# Patient Record
Sex: Female | Born: 1985 | Race: White | Hispanic: No | Marital: Married | Smoking: Former smoker
Health system: Southern US, Community
[De-identification: ages and names within clinical notes are randomized; demographics above are authoritative.]

## PROBLEM LIST (undated history)

## (undated) DIAGNOSIS — T7840XA Allergy, unspecified, initial encounter: Secondary | ICD-10-CM

## (undated) DIAGNOSIS — F419 Anxiety disorder, unspecified: Secondary | ICD-10-CM

## (undated) DIAGNOSIS — F32A Depression, unspecified: Secondary | ICD-10-CM

## (undated) DIAGNOSIS — Z789 Other specified health status: Secondary | ICD-10-CM

## (undated) HISTORY — DX: Anxiety disorder, unspecified: F41.9

## (undated) HISTORY — DX: Other specified health status: Z78.9

## (undated) HISTORY — PX: NO PAST SURGERIES: SHX2092

## (undated) HISTORY — DX: Depression, unspecified: F32.A

## (undated) HISTORY — DX: Allergy, unspecified, initial encounter: T78.40XA

---

## 2016-11-21 DIAGNOSIS — O99210 Obesity complicating pregnancy, unspecified trimester: Secondary | ICD-10-CM | POA: Insufficient documentation

## 2016-11-21 DIAGNOSIS — O99211 Obesity complicating pregnancy, first trimester: Secondary | ICD-10-CM | POA: Insufficient documentation

## 2019-11-03 ENCOUNTER — Ambulatory Visit (INDEPENDENT_AMBULATORY_CARE_PROVIDER_SITE_OTHER): Payer: BC Managed Care – PPO | Admitting: Obstetrics and Gynecology

## 2019-11-03 ENCOUNTER — Other Ambulatory Visit (HOSPITAL_COMMUNITY)
Admission: RE | Admit: 2019-11-03 | Discharge: 2019-11-03 | Disposition: A | Discharge status: Home / Self Care | Payer: BC Managed Care – PPO | Source: Ambulatory Visit | Attending: Obstetrics and Gynecology | Admitting: Obstetrics and Gynecology

## 2019-11-03 ENCOUNTER — Other Ambulatory Visit: Payer: Self-pay

## 2019-11-03 ENCOUNTER — Encounter: Payer: Self-pay | Admitting: Obstetrics and Gynecology

## 2019-11-03 VITALS — BP 129/82 | HR 129 | Ht 66.0 in

## 2019-11-03 DIAGNOSIS — Z23 Encounter for immunization: Secondary | ICD-10-CM

## 2019-11-03 DIAGNOSIS — F3281 Premenstrual dysphoric disorder: Secondary | ICD-10-CM

## 2019-11-03 DIAGNOSIS — Z124 Encounter for screening for malignant neoplasm of cervix: Secondary | ICD-10-CM

## 2019-11-03 DIAGNOSIS — Z01419 Encounter for gynecological examination (general) (routine) without abnormal findings: Secondary | ICD-10-CM

## 2019-11-03 DIAGNOSIS — Z1239 Encounter for other screening for malignant neoplasm of breast: Secondary | ICD-10-CM

## 2019-11-03 MED ORDER — CITALOPRAM HYDROBROMIDE 10 MG PO TABS: 20.0000 mg | ORAL_TABLET | Freq: Every day | ORAL | 11 refills | Status: DC

## 2019-11-03 NOTE — Patient Instructions (Signed)
Premenstrual Syndrome Premenstrual syndrome (PMS) is a group of physical, emotional, and behavioral symptoms that affect women of childbearing age as part of their menstrual cycle. PMS starts 1-2 weeks before the start of a woman's menstrual period and goes away a few days after menstrual bleeding starts. It often happens in a predictable pattern (recurs). PMS may cause other health conditions to become worse, such as asthma, allergies, and migraines. PMS can range from mild to severe. When it is severe, it is called premenstrual dysphoric disorder (PMDD). PMS may interfere with normal daily activities. What are the causes? The cause of this condition is not known, but it seems to be related to hormone changes that happen before menstruation. What are the signs or symptoms? Symptoms of this condition often happen every month. They go away completely after your period starts. Physical symptoms of this condition include:  Bloating.  Breast pain.  Headaches.  Extreme fatigue.  Backaches.  Swelling of the hands and feet.  Weight gain.  Hot flashes. Emotional and behavioral symptoms of this condition include:  Mood swings.  Depression.  Angry outbursts.  Irritability.  Anxiety.  Crying spells.  Food cravings or appetite changes.  Changes in sexual desire.  Confusion.  Aggression.  Social withdrawal.  Poor concentration. How is this diagnosed? This condition may be diagnosed based on a history of your symptoms. This condition is generally diagnosed if symptoms of PMS:  Are present in the 5 days before your period starts.  End within 4 days after your period starts.  Happen at least 3 months in a row.  Interfere with some of your normal activities. Other conditions that can cause some of these symptoms must be ruled out before PMS can be diagnosed. These include depression, anxiety, anemia, and thyroid problems. How is this treated? This condition may be treated  by:  Maintaining a healthy lifestyle. This includes eating a well-balanced diet and exercising regularly.  Taking medicines. Medicines can help relieve symptoms such as cramps, aches, pains, headaches, and breast tenderness. Depending on the severity of the condition, your health care provider may recommend various over-the-counter pain medicines. Follow these instructions at home: Eating and drinking   Eat a well-balanced diet.  Avoid caffeine and alcohol.  Limit the amount of salt and salty foods you eat. This will help reduce bloating.  Drink enough fluid to keep your urine pale yellow.  Take a multivitamin if told to do so by your health care provider. Lifestyle   Do not use any products that contain nicotine or tobacco, such as cigarettes, e-cigarettes, and chewing tobacco. If you need help quitting, ask your health care provider.  Exercise regularly as suggested by your health care provider.  Get enough sleep. For most adults, this is 7-8 hours of sleep each night.  Practice relaxation techniques such as yoga, tai chi, or meditation.  Find healthy ways to manage stress. General instructions   For 2-3 months, write down your symptoms, their severity, and how long they last. This will help your health care provider choose the best treatment for you.  Take over-the-counter and prescription medicines only as told by your health care provider.  If you are using birth control pills (oral contraceptives), use them as told by your health care provider. Contact a health care provider if:  Your symptoms get worse.  You develop new symptoms.  You have trouble doing your daily activities. Summary  Premenstrual syndrome (PMS) is a group of physical, emotional, and behavioral symptoms that   affect women of childbearing age.  PMS starts 1-2 weeks before the start of a woman's period and goes away a few days after the period starts.  PMS is treated by maintaining a healthy  lifestyle and taking medicines to relieve the symptoms. This information is not intended to replace advice given to you by your health care provider. Make sure you discuss any questions you have with your health care provider. Document Released: 12/06/2000 Document Revised: 07/22/2018 Document Reviewed: 07/22/2018 Elsevier Patient Education  2020 [employer].

## 2019-11-03 NOTE — Progress Notes (Signed)
Gynecology Annual Exam   PCP: No primary care provider on file.  Chief Complaint:  Chief Complaint  Patient presents with  . Gynecologic Exam    History of Present Illness: Patient is a 33 y.o. No obstetric history on file. presents for annual exam. The patient has no complaints today.   LMP: Patient's last menstrual period was 10/09/2019. Average Interval: regular, 28 days Duration of flow: 3 days Heavy Menses: no Clots: no Intermenstrual Bleeding: no Postcoital Bleeding: no Dysmenorrhea: yes  The patient is sexually active. She currently uses rhythm method for contraception. She denies dyspareunia.  The patient does perform self breast exams.  There is no notable family history of breast or ovarian cancer in her family.  The patient wears seatbelts: yes.   The patient has regular exercise: not asked.    The patient denies current symptoms of depression.    Review of Systems: Review of Systems  Constitutional: Negative for chills and fever.  HENT: Negative for congestion.   Respiratory: Negative for cough and shortness of breath.   Cardiovascular: Negative for chest pain and palpitations.  Gastrointestinal: Negative for abdominal pain, constipation, diarrhea, heartburn, nausea and vomiting.  Genitourinary: Negative for dysuria, frequency and urgency.  Skin: Negative for itching and rash.  Neurological: Negative for dizziness and headaches.  Endo/Heme/Allergies: Negative for polydipsia.  Psychiatric/Behavioral: Negative for depression.    Past Medical History:  Past Medical History:  Diagnosis Date  . No pertinent past medical history     Past Surgical History:  Past Surgical History:  Procedure Laterality Date  . NO PAST SURGERIES      Gynecologic History:  Patient's last menstrual period was 10/09/2019. Contraception: rhythm method  Obstetric History: No obstetric history on file.  Family History:  History reviewed. No pertinent family history.   Social History:  Social History   Socioeconomic History  . Marital status: Single    Spouse name: Not on file  . Number of children: Not on file  . Years of education: Not on file  . Highest education level: Not on file  Occupational History  . Occupation: Games developer  Social Needs  . Financial resource strain: Not on file  . Food insecurity    Worry: Not on file    Inability: Not on file  . Transportation needs    Medical: Not on file    Non-medical: Not on file  Tobacco Use  . Smoking status: Never Smoker  . Smokeless tobacco: Current User  Substance and Sexual Activity  . Alcohol use: Yes    Comment: Rerely  . Drug use: Never  . Sexual activity: Yes    Partners: Male    Birth control/protection: None  Lifestyle  . Physical activity    Days per week: Not on file    Minutes per session: Not on file  . Stress: Not on file  Relationships  . Social Musician on phone: Not on file    Gets together: Not on file    Attends religious service: Not on file    Active member of club or organization: Not on file    Attends meetings of clubs or organizations: Not on file    Relationship status: Not on file  . Intimate partner violence    Fear of current or ex partner: Not on file    Emotionally abused: Not on file    Physically abused: Not on file    Forced sexual activity: Not on  file  Other Topics Concern  . Not on file  Social History Narrative  . Not on file    Allergies:  No Known Allergies  Medications: Prior to Admission medications   Not on File    Physical Exam Vitals: Blood pressure 129/82, pulse (!) 129, height 5\' 6"  (1.676 m), last menstrual period 10/09/2019.   General: NAD HEENT: normocephalic, anicteric Thyroid: no enlargement, no palpable nodules Pulmonary: No increased work of breathing, CTAB Cardiovascular: RRR, distal pulses 2+ Breast: Breast symmetrical, no tenderness, no palpable nodules or masses, no skin or nipple  retraction present, no nipple discharge.  No axillary or supraclavicular lymphadenopathy. Abdomen: NABS, soft, non-tender, non-distended.  Umbilicus without lesions.  No hepatomegaly, splenomegaly or masses palpable. No evidence of hernia  Genitourinary:  External: Normal external female genitalia.  Normal urethral meatus, normal Bartholin's and Skene's glands.    Vagina: Normal vaginal mucosa, no evidence of prolapse.    Cervix: Grossly normal in appearance, no bleeding  Uterus: Non-enlarged, mobile, normal contour.  No CMT  Adnexa: ovaries non-enlarged, no adnexal masses  Rectal: deferred  Lymphatic: no evidence of inguinal lymphadenopathy Extremities: no edema, erythema, or tenderness Neurologic: Grossly intact Psychiatric: mood appropriate, affect full  Female chaperone present for pelvic and breast  portions of the physical exam    Assessment: 33 y.o. No obstetric history on file. routine annual exam  Plan: Problem List Items Addressed This Visit    None    Visit Diagnoses    Encounter for gynecological examination without abnormal finding    -  Primary   Screening for malignant neoplasm of cervix       Relevant Orders   Cytology - PAP   Breast screening       Flu vaccine need       Relevant Orders   Flu Vaccine QUAD 36+ mos IM (Fluarix, Quad PF) (Completed)   PMDD (premenstrual dysphoric disorder)       Relevant Medications   citalopram (CELEXA) 10 MG tablet      1) STI screening  wasoffered and declined  2)  ASCCP guidelines and rational discussed.  Patient opts for every 3 years screening interval  3) Contraception - the patient is currently using  rhythm method.  She is happy with her current form of contraception and plans to continue - considering conception in the next year, discussed starting PNV prior to conceiving  4) Routine healthcare maintenance including cholesterol, diabetes screening discussed managed by PCP  5) PMDD - start citalopram 10mg  po  daily.  Discussed continuous vs cyclical dosing.  6) Gardasil - started series shot 1/3.  Monogamous relationship/low risk discussed pros and cons of completing series at this time  7) Influenza vaccination - offered and accepted  8) Return in about 6 weeks (around 12/15/2019) for medication follow up phone.   Malachy Mood, MD, [facility], [facility] 11/03/2019, 2:58 PM

## 2019-11-09 LAB — CYTOLOGY - PAP
Adequacy: ABSENT
Comment: NEGATIVE
Comment: NEGATIVE
Comment: NEGATIVE
Diagnosis: NEGATIVE
HPV 16: NEGATIVE
HPV 18 / 45: NEGATIVE
High risk HPV: POSITIVE — AB

## 2019-11-26 ENCOUNTER — Telehealth: Payer: Self-pay | Admitting: Obstetrics and Gynecology

## 2019-11-26 NOTE — Telephone Encounter (Signed)
Patient is calling for labs results. Please advise. 

## 2019-11-29 ENCOUNTER — Encounter: Payer: Self-pay | Admitting: Obstetrics and Gynecology

## 2019-11-30 ENCOUNTER — Telehealth: Payer: Self-pay | Admitting: Obstetrics and Gynecology

## 2019-11-30 NOTE — Telephone Encounter (Signed)
Patient returning AMS call to go over results.  Same cb.

## 2019-12-10 ENCOUNTER — Ambulatory Visit: Payer: BC Managed Care – PPO | Admitting: Obstetrics and Gynecology

## 2020-12-23 NOTE — L&D Delivery Note (Signed)
Delivery Note At 8:12 PM a viable female was delivered via Vaginal, Spontaneous (Presentation: Left Occiput Anterior).  APGAR: 8, 9; weight  .   Placenta status: Spontaneous, Intact.  Cord: 3 vessels with the following complications: None.  Cord pH: N/A  Anesthesia: Epidural Episiotomy: None Lacerations: 2nd degree Suture Repair: 2.0 Vicryl on bulbocavernosus and 3.0 Monocryl for superficial layer Est. Blood Loss (mL): 750  Mom to postpartum.  Baby to Couplet care / Skin to Skin.  Vena [place] 12/22/2021, 8:50 PM

## 2021-05-16 ENCOUNTER — Encounter: Payer: BC Managed Care – PPO | Admitting: Obstetrics and Gynecology

## 2021-05-18 ENCOUNTER — Other Ambulatory Visit: Payer: Self-pay

## 2021-05-18 ENCOUNTER — Encounter: Payer: Self-pay | Admitting: Advanced Practice Midwife

## 2021-05-18 ENCOUNTER — Ambulatory Visit (INDEPENDENT_AMBULATORY_CARE_PROVIDER_SITE_OTHER): Payer: BC Managed Care – PPO | Admitting: Advanced Practice Midwife

## 2021-05-18 ENCOUNTER — Other Ambulatory Visit (HOSPITAL_COMMUNITY)
Admission: RE | Admit: 2021-05-18 | Discharge: 2021-05-18 | Disposition: A | Discharge status: Home / Self Care | Payer: BC Managed Care – PPO | Source: Ambulatory Visit | Attending: Obstetrics and Gynecology | Admitting: Obstetrics and Gynecology

## 2021-05-18 VITALS — BP 120/72 | HR 85 | Wt 220.0 lb

## 2021-05-18 DIAGNOSIS — Z1159 Encounter for screening for other viral diseases: Secondary | ICD-10-CM | POA: Diagnosis not present

## 2021-05-18 DIAGNOSIS — Z3401 Encounter for supervision of normal first pregnancy, first trimester: Secondary | ICD-10-CM

## 2021-05-18 DIAGNOSIS — Z369 Encounter for antenatal screening, unspecified: Secondary | ICD-10-CM | POA: Diagnosis not present

## 2021-05-18 DIAGNOSIS — O99211 Obesity complicating pregnancy, first trimester: Secondary | ICD-10-CM | POA: Diagnosis not present

## 2021-05-18 DIAGNOSIS — Z3490 Encounter for supervision of normal pregnancy, unspecified, unspecified trimester: Secondary | ICD-10-CM | POA: Insufficient documentation

## 2021-05-18 DIAGNOSIS — Z91410 Personal history of adult physical and sexual abuse: Secondary | ICD-10-CM

## 2021-05-18 DIAGNOSIS — Z124 Encounter for screening for malignant neoplasm of cervix: Secondary | ICD-10-CM | POA: Diagnosis not present

## 2021-05-18 DIAGNOSIS — N926 Irregular menstruation, unspecified: Secondary | ICD-10-CM

## 2021-05-18 DIAGNOSIS — Z113 Encounter for screening for infections with a predominantly sexual mode of transmission: Secondary | ICD-10-CM

## 2021-05-18 DIAGNOSIS — Z131 Encounter for screening for diabetes mellitus: Secondary | ICD-10-CM | POA: Diagnosis not present

## 2021-05-18 DIAGNOSIS — Z8659 Personal history of other mental and behavioral disorders: Secondary | ICD-10-CM

## 2021-05-18 DIAGNOSIS — Z3A12 12 weeks gestation of pregnancy: Secondary | ICD-10-CM

## 2021-05-18 LAB — POCT URINE PREGNANCY: Preg Test, Ur: POSITIVE — AB

## 2021-05-18 NOTE — Patient Instructions (Signed)
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
## 2021-05-19 LAB — HGB A1C W/O EAG: Hgb A1c MFr Bld: 5.1 % (ref 4.8–5.6)

## 2021-05-19 LAB — RPR+RH+ABO+RUB AB+AB SCR+CB...
Antibody Screen: NEGATIVE
HIV Screen 4th Generation wRfx: NONREACTIVE
Hematocrit: 42.3 % (ref 34.0–46.6)
Hemoglobin: 14.1 g/dL (ref 11.1–15.9)
Hepatitis B Surface Ag: NEGATIVE
MCH: 29.3 pg (ref 26.6–33.0)
MCHC: 33.3 g/dL (ref 31.5–35.7)
MCV: 88 fL (ref 79–97)
Platelets: 328 10*3/uL (ref 150–450)
RBC: 4.82 x10E6/uL (ref 3.77–5.28)
RDW: 13.1 % (ref 11.7–15.4)
RPR Ser Ql: NONREACTIVE
Rh Factor: NEGATIVE
Rubella Antibodies, IGG: 5.21 index (ref >0.99)
Varicella zoster IgG: 371 index (ref >165)
WBC: 10.8 10*3/uL (ref 3.4–10.8)

## 2021-05-19 LAB — HEPATITIS B SURFACE ANTIBODY,QUALITATIVE: Hep B Surface Ab, Qual: REACTIVE

## 2021-05-19 LAB — HEPATITIS C ANTIBODY: Hep C Virus Ab: <0.1 s/co ratio (ref 0.0–0.9)

## 2021-05-20 LAB — URINE CULTURE

## 2021-05-28 ENCOUNTER — Other Ambulatory Visit: Payer: Self-pay

## 2021-05-28 ENCOUNTER — Encounter: Payer: Self-pay | Admitting: Obstetrics and Gynecology

## 2021-05-28 ENCOUNTER — Ambulatory Visit (INDEPENDENT_AMBULATORY_CARE_PROVIDER_SITE_OTHER): Payer: BC Managed Care – PPO | Admitting: Obstetrics and Gynecology

## 2021-05-28 VITALS — BP 130/80 | Wt 220.0 lb

## 2021-05-28 DIAGNOSIS — O99211 Obesity complicating pregnancy, first trimester: Secondary | ICD-10-CM

## 2021-05-28 DIAGNOSIS — Z3A10 10 weeks gestation of pregnancy: Secondary | ICD-10-CM | POA: Diagnosis not present

## 2021-05-28 DIAGNOSIS — Z369 Encounter for antenatal screening, unspecified: Secondary | ICD-10-CM

## 2021-05-28 DIAGNOSIS — Z3401 Encounter for supervision of normal first pregnancy, first trimester: Secondary | ICD-10-CM | POA: Diagnosis not present

## 2021-05-28 LAB — CYTOLOGY - PAP
Chlamydia: NEGATIVE
Comment: NEGATIVE
Comment: NEGATIVE
Comment: NEGATIVE
Comment: NEGATIVE
Comment: NORMAL
HPV 16: NEGATIVE
HPV 18 / 45: NEGATIVE
High risk HPV: POSITIVE — AB
Neisseria Gonorrhea: NEGATIVE
Trichomonas: NEGATIVE
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
## 2021-05-30 ENCOUNTER — Encounter: Payer: Self-pay | Admitting: Obstetrics and Gynecology

## 2021-05-30 NOTE — Progress Notes (Signed)
ULTRASOUND REPORT  Location: [facility] Date of Service: 05/30/2021   Indications:dating Findings:  Mason Jim intrauterine pregnancy is visualized with a CRL consistent with [redacted]w[redacted]d gestation, giving an (U/S) EDD of 12/18/2021. The (U/S) EDD is not consistent with the clinically established EDD of 11/28/2021.  FHR: 182 BPM CRL measurement: 4.03 cm Yolk sac is not visualized. Amnion: not visualized   Right Ovary not visualized Left Ovary appears to have a small ~3 cm simple cyst Corpus luteal cyst:  is not visualized Survey of the adnexa demonstrates no adnexal masses. There is no free peritoneal fluid in the cul de sac.  Impression: 1. [redacted]w[redacted]d Viable Singleton Intrauterine pregnancy by U/S. 2. (U/S) EDD is not consistent with Clinically established EDD of 11/28/2021.   3. EDD changed based on today's ultrasound.  There is a viable singleton gestation.  Detailed evaluation of the fetal anatomy is precluded by early gestational age.  It must be noted that a normal ultrasound particular at this early gestational age is unable to rule out fetal aneuploidy, risk of first trimester miscarriage, or anatomic birth defects. I personally performed and interpreted the ultrasound.   Thomasene Mohair, MD, [facility], [facility] 05/30/2021 9:11 AM     Thomasene Mohair, MD

## 2021-06-26 ENCOUNTER — Ambulatory Visit (INDEPENDENT_AMBULATORY_CARE_PROVIDER_SITE_OTHER): Payer: BC Managed Care – PPO | Admitting: Obstetrics and Gynecology

## 2021-06-26 ENCOUNTER — Encounter: Payer: Self-pay | Admitting: Obstetrics and Gynecology

## 2021-06-26 ENCOUNTER — Other Ambulatory Visit: Payer: Self-pay

## 2021-06-26 VITALS — BP 120/80 | Ht 66.0 in | Wt 215.0 lb

## 2021-06-26 DIAGNOSIS — Z3401 Encounter for supervision of normal first pregnancy, first trimester: Secondary | ICD-10-CM | POA: Diagnosis not present

## 2021-06-26 DIAGNOSIS — Z1379 Encounter for other screening for genetic and chromosomal anomalies: Secondary | ICD-10-CM

## 2021-06-26 DIAGNOSIS — R87612 Low grade squamous intraepithelial lesion on cytologic smear of cervix (LGSIL): Secondary | ICD-10-CM | POA: Diagnosis not present

## 2021-06-26 DIAGNOSIS — Z3A15 15 weeks gestation of pregnancy: Secondary | ICD-10-CM | POA: Diagnosis not present

## 2021-06-26 NOTE — Progress Notes (Signed)
   HPI:  Erin Diaz is a 35 y.o.  G1P0000  who presents today for evaluation and management of abnormal cervical cytology.    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 No pertinent family history.  ALLERGIES:  Patient has no known allergies.  No current outpatient medications on file prior to visit.   No current facility-administered medications on file prior to visit.    Physical Exam: -Vitals:  BP 120/80   Ht 5\' 6"  (1.676 m)   Wt 215 lb (97.5 kg)   LMP 02/21/2021 (Within Days)   BMI 34.70 kg/m  GEN: WD, WN, NAD.  A+ O x 3, good mood and affect. ABD:  NT, ND.  Soft, no masses.  No hernias noted.   Pelvic:   Vulva: Normal appearance.  No lesions.  Vagina: No lesions or abnormalities noted.  Support: Normal pelvic support.  Urethra No masses tenderness or scarring.  Meatus Normal size without lesions or prolapse.  Cervix: See below.  Anus: Normal exam.  No lesions.  Perineum: Normal exam.  No lesions.        Bimanual   Uterus: Normal size.  Non-tender.  Mobile.  AV.  Adnexae: No masses.  Non-tender to palpation.  Cul-de-sac: Negative for abnormality.   PROCEDURE: 1.  Urine Pregnancy Test:  not done 2.  Colposcopy performed with 4% acetic acid after verbal consent obtained                                        -Aceto-white Lesions Location(s): diffusely  mild-to-moderate from 12-2 o'clock and posteriorly 4-6 o'clock.              -Biopsy not performed              -ECC indicated and performed: No.     -Biopsy sites made hemostatic with pressure, AgNO3, and/or Monsel's solution   -Satisfactory colposcopy: Yes.      -Evidence of Invasive cervical CA: NO  ASSESSMENT:  Erin Diaz is a 35 y.o. G1P0000 here for  1. LGSIL on Pap smear of cervix    PLAN: I discussed the grading system of pap smears and HPV high risk viral types.  We will discuss and base management after colpo results return.  anatomy u/s order placed today NIPT today      34, MD  [facility], [facility] 06/26/2021  5:20 PM

## 2021-06-28 ENCOUNTER — Other Ambulatory Visit: Payer: Self-pay

## 2021-06-28 ENCOUNTER — Ambulatory Visit (INDEPENDENT_AMBULATORY_CARE_PROVIDER_SITE_OTHER): Payer: BC Managed Care – PPO

## 2021-06-28 ENCOUNTER — Telehealth: Payer: Self-pay

## 2021-06-28 ENCOUNTER — Other Ambulatory Visit: Payer: Self-pay | Admitting: Obstetrics & Gynecology

## 2021-06-28 DIAGNOSIS — R350 Frequency of micturition: Secondary | ICD-10-CM

## 2021-06-28 LAB — POCT URINALYSIS DIPSTICK
Bilirubin, UA: NEGATIVE
Glucose, UA: NEGATIVE
Ketones, UA: NEGATIVE
Nitrite, UA: NEGATIVE
Protein, UA: NEGATIVE
Spec Grav, UA: 1.015 (ref 1.010–1.025)
Urobilinogen, UA: 0.2 E.U./dL
pH, UA: 7.5 (ref 5.0–8.0)

## 2021-06-28 MED ORDER — NITROFURANTOIN MONOHYD MACRO 100 MG PO CAPS: 100.0000 mg | ORAL_CAPSULE | Freq: Two times a day (BID) | ORAL | 0 refills | Status: DC

## 2021-06-28 NOTE — Progress Notes (Signed)
Let her know preg safe ABX called in

## 2021-06-28 NOTE — Progress Notes (Signed)
Presents today for urinary symptoms (frequency). Urinalysis performed and forwarded to [facility] for advise.

## 2021-06-30 LAB — MATERNIT 21 PLUS CORE, BLOOD
Fetal Fraction: 4
Result (T21): NEGATIVE
Trisomy 13 (Patau syndrome): NEGATIVE
Trisomy 18 (Edwards syndrome): NEGATIVE
Trisomy 21 (Down syndrome): NEGATIVE

## 2021-07-07 ENCOUNTER — Other Ambulatory Visit: Payer: Self-pay

## 2021-07-10 ENCOUNTER — Other Ambulatory Visit: Payer: Self-pay | Admitting: Obstetrics & Gynecology

## 2021-07-10 MED ORDER — AMOXICILLIN 500 MG PO CAPS: 500.0000 mg | ORAL_CAPSULE | Freq: Three times a day (TID) | ORAL | 2 refills | Status: DC

## 2021-07-24 ENCOUNTER — Encounter: Payer: BC Managed Care – PPO | Admitting: Obstetrics & Gynecology

## 2021-07-31 ENCOUNTER — Ambulatory Visit
Admission: RE | Admit: 2021-07-31 | Discharge: 2021-07-31 | Disposition: A | Discharge status: Home / Self Care | Payer: BC Managed Care – PPO | Source: Ambulatory Visit | Attending: Obstetrics and Gynecology | Admitting: Obstetrics and Gynecology

## 2021-07-31 ENCOUNTER — Other Ambulatory Visit: Payer: Self-pay

## 2021-07-31 DIAGNOSIS — Z3A20 20 weeks gestation of pregnancy: Secondary | ICD-10-CM | POA: Diagnosis not present

## 2021-07-31 DIAGNOSIS — Z3401 Encounter for supervision of normal first pregnancy, first trimester: Secondary | ICD-10-CM | POA: Diagnosis not present

## 2021-07-31 DIAGNOSIS — Z3492 Encounter for supervision of normal pregnancy, unspecified, second trimester: Secondary | ICD-10-CM | POA: Diagnosis not present

## 2021-08-01 ENCOUNTER — Encounter: Payer: Self-pay | Admitting: Obstetrics and Gynecology

## 2021-08-01 ENCOUNTER — Ambulatory Visit (INDEPENDENT_AMBULATORY_CARE_PROVIDER_SITE_OTHER): Payer: BC Managed Care – PPO | Admitting: Obstetrics and Gynecology

## 2021-08-01 VITALS — BP 118/70 | Ht 66.0 in | Wt 217.6 lb

## 2021-08-01 DIAGNOSIS — Z3A20 20 weeks gestation of pregnancy: Secondary | ICD-10-CM

## 2021-08-01 DIAGNOSIS — Z3402 Encounter for supervision of normal first pregnancy, second trimester: Secondary | ICD-10-CM

## 2021-08-01 DIAGNOSIS — O99212 Obesity complicating pregnancy, second trimester: Secondary | ICD-10-CM

## 2021-08-01 DIAGNOSIS — Z6791 Unspecified blood type, Rh negative: Secondary | ICD-10-CM

## 2021-08-01 DIAGNOSIS — O26899 Other specified pregnancy related conditions, unspecified trimester: Secondary | ICD-10-CM | POA: Insufficient documentation
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
## 2021-08-31 ENCOUNTER — Ambulatory Visit (INDEPENDENT_AMBULATORY_CARE_PROVIDER_SITE_OTHER): Payer: BC Managed Care – PPO | Admitting: Obstetrics and Gynecology

## 2021-08-31 ENCOUNTER — Encounter: Payer: Self-pay | Admitting: Obstetrics and Gynecology

## 2021-08-31 ENCOUNTER — Other Ambulatory Visit: Payer: Self-pay

## 2021-08-31 VITALS — BP 118/80 | Wt 221.0 lb

## 2021-08-31 DIAGNOSIS — Z3A24 24 weeks gestation of pregnancy: Secondary | ICD-10-CM

## 2021-08-31 DIAGNOSIS — O99212 Obesity complicating pregnancy, second trimester: Secondary | ICD-10-CM

## 2021-08-31 DIAGNOSIS — Z113 Encounter for screening for infections with a predominantly sexual mode of transmission: Secondary | ICD-10-CM

## 2021-08-31 DIAGNOSIS — Z6791 Unspecified blood type, Rh negative: Secondary | ICD-10-CM

## 2021-08-31 DIAGNOSIS — O26899 Other specified pregnancy related conditions, unspecified trimester: Secondary | ICD-10-CM

## 2021-08-31 DIAGNOSIS — Z131 Encounter for screening for diabetes mellitus: Secondary | ICD-10-CM

## 2021-08-31 DIAGNOSIS — Z3402 Encounter for supervision of normal first pregnancy, second trimester: Secondary | ICD-10-CM
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
## 2021-09-28 ENCOUNTER — Encounter: Payer: Self-pay | Admitting: Advanced Practice Midwife

## 2021-09-28 ENCOUNTER — Ambulatory Visit (INDEPENDENT_AMBULATORY_CARE_PROVIDER_SITE_OTHER): Payer: BC Managed Care – PPO | Admitting: Advanced Practice Midwife

## 2021-09-28 ENCOUNTER — Other Ambulatory Visit: Payer: BC Managed Care – PPO

## 2021-09-28 ENCOUNTER — Other Ambulatory Visit: Payer: Self-pay

## 2021-09-28 VITALS — BP 120/80 | Wt 220.0 lb

## 2021-09-28 DIAGNOSIS — Z6791 Unspecified blood type, Rh negative: Secondary | ICD-10-CM | POA: Diagnosis not present

## 2021-09-28 DIAGNOSIS — Z3A28 28 weeks gestation of pregnancy: Secondary | ICD-10-CM

## 2021-09-28 DIAGNOSIS — O26893 Other specified pregnancy related conditions, third trimester: Secondary | ICD-10-CM

## 2021-09-28 DIAGNOSIS — Z3403 Encounter for supervision of normal first pregnancy, third trimester: Secondary | ICD-10-CM | POA: Diagnosis not present

## 2021-09-28 DIAGNOSIS — Z131 Encounter for screening for diabetes mellitus: Secondary | ICD-10-CM

## 2021-09-28 DIAGNOSIS — Z113 Encounter for screening for infections with a predominantly sexual mode of transmission: Secondary | ICD-10-CM

## 2021-09-28 DIAGNOSIS — Z130 Encounter for screening for diseases of the blood and blood-forming organs and certain disorders involving the immune mechanism: Secondary | ICD-10-CM

## 2021-09-28 DIAGNOSIS — Z369 Encounter for antenatal screening, unspecified: Secondary | ICD-10-CM

## 2021-09-28 LAB — POCT URINALYSIS DIPSTICK OB
Glucose, UA: NEGATIVE
POC,PROTEIN,UA: NEGATIVE

## 2021-09-28 MED ORDER — RHO D IMMUNE GLOBULIN 1500 UNIT/2ML IJ SOSY
300.0000 ug | PREFILLED_SYRINGE | Freq: Once | INTRAMUSCULAR | Status: AC
  Administered 2021-09-28: 300 ug via INTRAMUSCULAR
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
## 2021-09-28 NOTE — Patient Instructions (Signed)

## 2021-09-28 NOTE — Addendum Note (Signed)
Addended by: Cornelius Moras D on: 09/28/2021 03:46 PM   Modules accepted: Orders

## 2021-09-28 NOTE — Addendum Note (Signed)
Addended by: Cornelius Moras D on: 09/28/2021 04:03 PM   Modules accepted: Orders

## 2021-09-29 LAB — 28 WEEKS RH-PANEL
Antibody Screen: NEGATIVE
Basophils Absolute: 0 10*3/uL (ref 0.0–0.2)
Basos: 0 %
EOS (ABSOLUTE): 0 10*3/uL (ref 0.0–0.4)
Eos: 0 %
Gestational Diabetes Screen: 124 mg/dL (ref 65–139)
HIV Screen 4th Generation wRfx: NONREACTIVE
Hematocrit: 37 % (ref 34.0–46.6)
Hemoglobin: 12.6 g/dL (ref 11.1–15.9)
Immature Grans (Abs): 0.1 10*3/uL (ref 0.0–0.1)
Immature Granulocytes: 1 %
Lymphocytes Absolute: 2.8 10*3/uL (ref 0.7–3.1)
Lymphs: 20 %
MCH: 30.1 pg (ref 26.6–33.0)
MCHC: 34.1 g/dL (ref 31.5–35.7)
MCV: 89 fL (ref 79–97)
Monocytes Absolute: 0.7 10*3/uL (ref 0.1–0.9)
Monocytes: 5 %
Neutrophils Absolute: 10.2 10*3/uL — ABNORMAL HIGH (ref 1.4–7.0)
Neutrophils: 74 %
Platelets: 308 10*3/uL (ref 150–450)
RBC: 4.18 x10E6/uL (ref 3.77–5.28)
RDW: 12.7 % (ref 11.7–15.4)
RPR Ser Ql: NONREACTIVE
WBC: 13.8 10*3/uL — ABNORMAL HIGH (ref 3.4–10.8)

## 2021-10-09 ENCOUNTER — Ambulatory Visit (INDEPENDENT_AMBULATORY_CARE_PROVIDER_SITE_OTHER): Payer: BC Managed Care – PPO

## 2021-10-09 ENCOUNTER — Other Ambulatory Visit: Payer: Self-pay | Admitting: Obstetrics

## 2021-10-09 ENCOUNTER — Other Ambulatory Visit: Payer: Self-pay

## 2021-10-09 DIAGNOSIS — R300 Dysuria: Secondary | ICD-10-CM | POA: Diagnosis not present

## 2021-10-09 DIAGNOSIS — O26893 Other specified pregnancy related conditions, third trimester: Secondary | ICD-10-CM

## 2021-10-09 LAB — POCT URINALYSIS DIPSTICK
Bilirubin, UA: NEGATIVE
Blood, UA: NEGATIVE
Glucose, UA: NEGATIVE
Ketones, UA: NEGATIVE
Nitrite, UA: NEGATIVE
Protein, UA: NEGATIVE
Spec Grav, UA: 1.01 (ref 1.010–1.025)
Urobilinogen, UA: 0.2 E.U./dL
pH, UA: 8 (ref 5.0–8.0)

## 2021-10-10 ENCOUNTER — Encounter: Payer: Self-pay | Admitting: Advanced Practice Midwife

## 2021-10-10 ENCOUNTER — Ambulatory Visit (INDEPENDENT_AMBULATORY_CARE_PROVIDER_SITE_OTHER): Payer: BC Managed Care – PPO | Admitting: Advanced Practice Midwife

## 2021-10-10 VITALS — BP 121/79 | Wt 224.0 lb

## 2021-10-10 DIAGNOSIS — Z3403 Encounter for supervision of normal first pregnancy, third trimester: Secondary | ICD-10-CM

## 2021-10-10 DIAGNOSIS — Z3A30 30 weeks gestation of pregnancy: Secondary | ICD-10-CM

## 2021-10-10 DIAGNOSIS — R399 Unspecified symptoms and signs involving the genitourinary system: Secondary | ICD-10-CM

## 2021-10-10 LAB — POCT URINALYSIS DIPSTICK OB
Glucose, UA: NEGATIVE
POC,PROTEIN,UA: NEGATIVE

## 2021-10-10 MED ORDER — CEPHALEXIN 500 MG PO CAPS: 500.0000 mg | ORAL_CAPSULE | Freq: Three times a day (TID) | ORAL | 0 refills | Status: DC

## 2021-10-10 NOTE — Progress Notes (Signed)
ROB - no concerns. RM 2 
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
## 2021-10-12 ENCOUNTER — Encounter: Payer: BC Managed Care – PPO | Admitting: Obstetrics

## 2021-10-12 LAB — URINE CULTURE

## 2021-10-26 ENCOUNTER — Ambulatory Visit (INDEPENDENT_AMBULATORY_CARE_PROVIDER_SITE_OTHER): Payer: BC Managed Care – PPO | Admitting: Advanced Practice Midwife

## 2021-10-26 ENCOUNTER — Other Ambulatory Visit: Payer: Self-pay

## 2021-10-26 ENCOUNTER — Encounter: Payer: Self-pay | Admitting: Advanced Practice Midwife

## 2021-10-26 VITALS — BP 120/60 | Wt 224.0 lb

## 2021-10-26 DIAGNOSIS — Z3403 Encounter for supervision of normal first pregnancy, third trimester: Secondary | ICD-10-CM

## 2021-10-26 DIAGNOSIS — Z23 Encounter for immunization: Secondary | ICD-10-CM

## 2021-10-26 DIAGNOSIS — Z3A32 32 weeks gestation of pregnancy: Secondary | ICD-10-CM

## 2021-10-26 NOTE — Progress Notes (Signed)
ROB- TDAP and BT consent today, no concerns
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
## 2021-10-26 NOTE — Addendum Note (Signed)
Addended by: Donnetta Hail on: 10/26/2021 04:07 PM   Modules accepted: Orders

## 2021-11-02 ENCOUNTER — Encounter: Payer: BC Managed Care – PPO | Admitting: Advanced Practice Midwife

## 2021-11-09 ENCOUNTER — Other Ambulatory Visit: Payer: Self-pay

## 2021-11-09 ENCOUNTER — Ambulatory Visit (INDEPENDENT_AMBULATORY_CARE_PROVIDER_SITE_OTHER): Payer: BC Managed Care – PPO | Admitting: Obstetrics and Gynecology

## 2021-11-09 ENCOUNTER — Encounter: Payer: Self-pay | Admitting: Obstetrics and Gynecology

## 2021-11-09 VITALS — BP 124/70 | Wt 225.0 lb

## 2021-11-09 DIAGNOSIS — Z3403 Encounter for supervision of normal first pregnancy, third trimester: Secondary | ICD-10-CM

## 2021-11-09 DIAGNOSIS — Z6791 Unspecified blood type, Rh negative: Secondary | ICD-10-CM

## 2021-11-09 DIAGNOSIS — Z3A34 34 weeks gestation of pregnancy: Secondary | ICD-10-CM

## 2021-11-09 DIAGNOSIS — O26899 Other specified pregnancy related conditions, unspecified trimester: Secondary | ICD-10-CM

## 2021-11-09 DIAGNOSIS — O99213 Obesity complicating pregnancy, third trimester: Secondary | ICD-10-CM
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
## 2021-11-23 ENCOUNTER — Ambulatory Visit (INDEPENDENT_AMBULATORY_CARE_PROVIDER_SITE_OTHER): Payer: BC Managed Care – PPO | Admitting: Obstetrics

## 2021-11-23 ENCOUNTER — Other Ambulatory Visit: Payer: Self-pay

## 2021-11-23 VITALS — BP 110/60 | Wt 228.0 lb

## 2021-11-23 DIAGNOSIS — Z3A36 36 weeks gestation of pregnancy: Secondary | ICD-10-CM

## 2021-11-23 DIAGNOSIS — Z3480 Encounter for supervision of other normal pregnancy, unspecified trimester: Secondary | ICD-10-CM

## 2021-11-23 LAB — POCT URINALYSIS DIPSTICK OB
Glucose, UA: NEGATIVE
POC,PROTEIN,UA: NEGATIVE
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
## 2021-11-23 NOTE — Progress Notes (Signed)
ROB- GBS, no concerns

## 2021-11-27 LAB — CULTURE, BETA STREP (GROUP B ONLY): Strep Gp B Culture: NEGATIVE

## 2021-11-28 ENCOUNTER — Other Ambulatory Visit: Payer: Self-pay

## 2021-11-28 ENCOUNTER — Ambulatory Visit (INDEPENDENT_AMBULATORY_CARE_PROVIDER_SITE_OTHER): Payer: BC Managed Care – PPO | Admitting: Obstetrics

## 2021-11-28 VITALS — BP 118/70 | Wt 231.0 lb

## 2021-11-28 DIAGNOSIS — Z3480 Encounter for supervision of other normal pregnancy, unspecified trimester: Secondary | ICD-10-CM

## 2021-11-28 DIAGNOSIS — Z3A37 37 weeks gestation of pregnancy: Secondary | ICD-10-CM

## 2021-11-28 NOTE — Progress Notes (Signed)
No vb. No lof.  
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
## 2021-11-30 ENCOUNTER — Encounter: Payer: BC Managed Care – PPO | Admitting: Licensed Practical Nurse

## 2021-12-06 ENCOUNTER — Other Ambulatory Visit: Payer: Self-pay

## 2021-12-06 ENCOUNTER — Encounter: Payer: Self-pay | Admitting: Advanced Practice Midwife

## 2021-12-06 ENCOUNTER — Ambulatory Visit (INDEPENDENT_AMBULATORY_CARE_PROVIDER_SITE_OTHER): Payer: BC Managed Care – PPO | Admitting: Advanced Practice Midwife

## 2021-12-06 VITALS — BP 120/80 | Wt 234.0 lb

## 2021-12-06 DIAGNOSIS — Z3403 Encounter for supervision of normal first pregnancy, third trimester: Secondary | ICD-10-CM

## 2021-12-06 DIAGNOSIS — Z3A38 38 weeks gestation of pregnancy: Secondary | ICD-10-CM
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
## 2021-12-07 ENCOUNTER — Encounter: Payer: BC Managed Care – PPO | Admitting: Obstetrics

## 2021-12-13 ENCOUNTER — Encounter: Payer: Self-pay | Admitting: Advanced Practice Midwife

## 2021-12-13 ENCOUNTER — Encounter: Payer: BC Managed Care – PPO | Admitting: Advanced Practice Midwife

## 2021-12-13 ENCOUNTER — Other Ambulatory Visit: Payer: Self-pay

## 2021-12-13 ENCOUNTER — Ambulatory Visit (INDEPENDENT_AMBULATORY_CARE_PROVIDER_SITE_OTHER): Payer: BC Managed Care – PPO | Admitting: Advanced Practice Midwife

## 2021-12-13 VITALS — BP 124/74 | Wt 231.0 lb

## 2021-12-13 DIAGNOSIS — Z3403 Encounter for supervision of normal first pregnancy, third trimester: Secondary | ICD-10-CM

## 2021-12-13 DIAGNOSIS — Z3A39 39 weeks gestation of pregnancy: Secondary | ICD-10-CM

## 2021-12-13 NOTE — Progress Notes (Signed)
No vb. No lof. No cervical check.
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
## 2021-12-20 ENCOUNTER — Ambulatory Visit (INDEPENDENT_AMBULATORY_CARE_PROVIDER_SITE_OTHER): Payer: BC Managed Care – PPO | Admitting: Advanced Practice Midwife

## 2021-12-20 ENCOUNTER — Encounter: Payer: Self-pay | Admitting: Advanced Practice Midwife

## 2021-12-20 ENCOUNTER — Other Ambulatory Visit: Payer: Self-pay

## 2021-12-20 VITALS — BP 126/72 | Wt 235.0 lb

## 2021-12-20 DIAGNOSIS — Z3403 Encounter for supervision of normal first pregnancy, third trimester: Secondary | ICD-10-CM

## 2021-12-20 DIAGNOSIS — Z3A40 40 weeks gestation of pregnancy: Secondary | ICD-10-CM

## 2021-12-20 NOTE — Progress Notes (Signed)
ROB - no concerns. RM 4 °

## 2021-12-21 ENCOUNTER — Other Ambulatory Visit
Admission: RE | Admit: 2021-12-21 | Discharge: 2021-12-21 | Disposition: A | Discharge status: Home / Self Care | Payer: BC Managed Care – PPO | Source: Ambulatory Visit | Attending: Advanced Practice Midwife | Admitting: Advanced Practice Midwife

## 2021-12-21 DIAGNOSIS — Z3A40 40 weeks gestation of pregnancy: Secondary | ICD-10-CM | POA: Diagnosis not present

## 2021-12-21 DIAGNOSIS — D62 Acute posthemorrhagic anemia: Secondary | ICD-10-CM | POA: Diagnosis not present

## 2021-12-21 DIAGNOSIS — Z01818 Encounter for other preprocedural examination: Secondary | ICD-10-CM

## 2021-12-21 DIAGNOSIS — O26893 Other specified pregnancy related conditions, third trimester: Secondary | ICD-10-CM | POA: Diagnosis not present

## 2021-12-21 DIAGNOSIS — O9081 Anemia of the puerperium: Secondary | ICD-10-CM | POA: Diagnosis not present

## 2021-12-21 DIAGNOSIS — Z01812 Encounter for preprocedural laboratory examination: Secondary | ICD-10-CM | POA: Insufficient documentation

## 2021-12-21 DIAGNOSIS — Z20822 Contact with and (suspected) exposure to covid-19: Secondary | ICD-10-CM | POA: Diagnosis not present

## 2021-12-21 DIAGNOSIS — Z87891 Personal history of nicotine dependence: Secondary | ICD-10-CM | POA: Diagnosis not present

## 2021-12-21 DIAGNOSIS — O480 Post-term pregnancy: Secondary | ICD-10-CM | POA: Diagnosis not present
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
## 2021-12-22 ENCOUNTER — Inpatient Hospital Stay
Admission: EM | Admit: 2021-12-22 | Discharge: 2021-12-24 | DRG: 806 | Disposition: A | Discharge status: Home / Self Care | Payer: BC Managed Care – PPO | Attending: Obstetrics and Gynecology | Admitting: Obstetrics and Gynecology

## 2021-12-22 ENCOUNTER — Encounter: Payer: Self-pay | Admitting: Obstetrics and Gynecology

## 2021-12-22 ENCOUNTER — Inpatient Hospital Stay: Payer: BC Managed Care – PPO | Admitting: Anesthesiology

## 2021-12-22 ENCOUNTER — Other Ambulatory Visit: Payer: Self-pay

## 2021-12-22 DIAGNOSIS — O26899 Other specified pregnancy related conditions, unspecified trimester: Secondary | ICD-10-CM

## 2021-12-22 DIAGNOSIS — O9081 Anemia of the puerperium: Secondary | ICD-10-CM | POA: Diagnosis not present

## 2021-12-22 DIAGNOSIS — Z87891 Personal history of nicotine dependence: Secondary | ICD-10-CM | POA: Diagnosis not present

## 2021-12-22 DIAGNOSIS — Z91410 Personal history of adult physical and sexual abuse: Secondary | ICD-10-CM

## 2021-12-22 DIAGNOSIS — Z3A40 40 weeks gestation of pregnancy: Secondary | ICD-10-CM

## 2021-12-22 DIAGNOSIS — Z6791 Unspecified blood type, Rh negative: Secondary | ICD-10-CM

## 2021-12-22 DIAGNOSIS — O479 False labor, unspecified: Secondary | ICD-10-CM | POA: Diagnosis present

## 2021-12-22 DIAGNOSIS — Z3403 Encounter for supervision of normal first pregnancy, third trimester: Secondary | ICD-10-CM

## 2021-12-22 DIAGNOSIS — Z8659 Personal history of other mental and behavioral disorders: Secondary | ICD-10-CM

## 2021-12-22 DIAGNOSIS — D62 Acute posthemorrhagic anemia: Secondary | ICD-10-CM | POA: Diagnosis not present

## 2021-12-22 DIAGNOSIS — Z20822 Contact with and (suspected) exposure to covid-19: Secondary | ICD-10-CM | POA: Diagnosis present

## 2021-12-22 DIAGNOSIS — O26893 Other specified pregnancy related conditions, third trimester: Principal | ICD-10-CM | POA: Diagnosis present

## 2021-12-22 DIAGNOSIS — O99213 Obesity complicating pregnancy, third trimester: Secondary | ICD-10-CM

## 2021-12-22 LAB — CBC
HCT: 35.7 % — ABNORMAL LOW (ref 36.0–46.0)
Hemoglobin: 12.5 g/dL (ref 12.0–15.0)
MCH: 30.3 pg (ref 26.0–34.0)
MCHC: 35 g/dL (ref 30.0–36.0)
MCV: 86.4 fL (ref 80.0–100.0)
Platelets: 217 10*3/uL (ref 150–400)
RBC: 4.13 MIL/uL (ref 3.87–5.11)
RDW: 14.2 % (ref 11.5–15.5)
WBC: 14.1 10*3/uL — ABNORMAL HIGH (ref 4.0–10.5)
nRBC: 0 % (ref 0.0–0.2)

## 2021-12-22 LAB — TYPE AND SCREEN
ABO/RH(D): O NEG
Antibody Screen: POSITIVE

## 2021-12-22 LAB — RAPID HIV SCREEN (HIV 1/2 AB+AG)
HIV 1/2 Antibodies: NONREACTIVE
HIV-1 P24 Antigen - HIV24: NONREACTIVE

## 2021-12-22 LAB — ABO/RH: ABO/RH(D): O NEG

## 2021-12-22 LAB — RPR: RPR Ser Ql: NONREACTIVE

## 2021-12-22 LAB — SARS CORONAVIRUS 2 (TAT 6-24 HRS): SARS Coronavirus 2: NEGATIVE

## 2021-12-22 MED ORDER — OXYTOCIN-SODIUM CHLORIDE 30-0.9 UT/500ML-% IV SOLN
2.5000 [IU]/h | INTRAVENOUS | Status: DC
  Administered 2021-12-22: 2.5 [IU]/h via INTRAVENOUS
  Filled 2021-12-22: qty 500

## 2021-12-22 MED ORDER — LIDOCAINE HCL (PF) 1 % IJ SOLN
INTRAMUSCULAR | Status: DC | PRN
  Administered 2021-12-22: 3 mL via SUBCUTANEOUS

## 2021-12-22 MED ORDER — OXYTOCIN-SODIUM CHLORIDE 30-0.9 UT/500ML-% IV SOLN
INTRAVENOUS | Status: AC
  Filled 2021-12-22: qty 500

## 2021-12-22 MED ORDER — TETANUS-DIPHTH-ACELL PERTUSSIS 5-2.5-18.5 LF-MCG/0.5 IM SUSY
0.5000 mL | PREFILLED_SYRINGE | Freq: Once | INTRAMUSCULAR | Status: DC
  Filled 2021-12-22: qty 0.5

## 2021-12-22 MED ORDER — SODIUM CHLORIDE 0.9 % IV SOLN
INTRAVENOUS | Status: DC | PRN
  Administered 2021-12-22: 10 mL via EPIDURAL

## 2021-12-22 MED ORDER — ONDANSETRON HCL 4 MG/2ML IJ SOLN: 4.0000 mg | INTRAMUSCULAR | Status: DC | PRN

## 2021-12-22 MED ORDER — BENZOCAINE-MENTHOL 20-0.5 % EX AERO
1.0000 "application " | INHALATION_SPRAY | CUTANEOUS | Status: DC | PRN
  Filled 2021-12-22: qty 56

## 2021-12-22 MED ORDER — IBUPROFEN 600 MG PO TABS
600.0000 mg | ORAL_TABLET | Freq: Four times a day (QID) | ORAL | Status: DC
  Administered 2021-12-23 – 2021-12-24 (×7): 600 mg via ORAL
  Filled 2021-12-22 (×7): qty 1

## 2021-12-22 MED ORDER — SIMETHICONE 80 MG PO CHEW: 80.0000 mg | CHEWABLE_TABLET | ORAL | Status: DC | PRN

## 2021-12-22 MED ORDER — EPHEDRINE 5 MG/ML INJ: 10.0000 mg | INTRAVENOUS | Status: DC | PRN

## 2021-12-22 MED ORDER — LACTATED RINGERS IV SOLN: 500.0000 mL | INTRAVENOUS | Status: DC | PRN

## 2021-12-22 MED ORDER — WITCH HAZEL-GLYCERIN EX PADS
1.0000 "application " | MEDICATED_PAD | CUTANEOUS | Status: DC | PRN
  Filled 2021-12-22: qty 100

## 2021-12-22 MED ORDER — LIDOCAINE HCL (PF) 1 % IJ SOLN
30.0000 mL | INTRAMUSCULAR | Status: DC | PRN
  Filled 2021-12-22: qty 30

## 2021-12-22 MED ORDER — PHENYLEPHRINE 40 MCG/ML (10ML) SYRINGE FOR IV PUSH (FOR BLOOD PRESSURE SUPPORT): 80.0000 ug | PREFILLED_SYRINGE | INTRAVENOUS | Status: DC | PRN

## 2021-12-22 MED ORDER — FENTANYL-BUPIVACAINE-NACL 0.5-0.125-0.9 MG/250ML-% EP SOLN
EPIDURAL | Status: AC
  Filled 2021-12-22: qty 250

## 2021-12-22 MED ORDER — ACETAMINOPHEN 325 MG PO TABS: 650.0000 mg | ORAL_TABLET | ORAL | Status: DC | PRN

## 2021-12-22 MED ORDER — LIDOCAINE-EPINEPHRINE (PF) 1.5 %-1:200000 IJ SOLN
INTRAMUSCULAR | Status: DC | PRN
  Administered 2021-12-22: 3 mL via EPIDURAL

## 2021-12-22 MED ORDER — AMMONIA AROMATIC IN INHA
RESPIRATORY_TRACT | Status: AC
  Filled 2021-12-22: qty 10

## 2021-12-22 MED ORDER — ONDANSETRON HCL 4 MG/2ML IJ SOLN: 4.0000 mg | Freq: Four times a day (QID) | INTRAMUSCULAR | Status: DC | PRN

## 2021-12-22 MED ORDER — COCONUT OIL OIL
1.0000 "application " | TOPICAL_OIL | Status: DC | PRN
  Filled 2021-12-22 (×2): qty 120

## 2021-12-22 MED ORDER — DIPHENHYDRAMINE HCL 25 MG PO CAPS: 25.0000 mg | ORAL_CAPSULE | Freq: Four times a day (QID) | ORAL | Status: DC | PRN

## 2021-12-22 MED ORDER — OXYCODONE-ACETAMINOPHEN 5-325 MG PO TABS: 1.0000 | ORAL_TABLET | ORAL | Status: DC | PRN

## 2021-12-22 MED ORDER — OXYTOCIN-SODIUM CHLORIDE 30-0.9 UT/500ML-% IV SOLN
1.0000 m[IU]/min | INTRAVENOUS | Status: DC
  Administered 2021-12-22: 2 m[IU]/min via INTRAVENOUS

## 2021-12-22 MED ORDER — OXYTOCIN 10 UNIT/ML IJ SOLN
INTRAMUSCULAR | Status: AC
  Filled 2021-12-22: qty 2

## 2021-12-22 MED ORDER — DIPHENHYDRAMINE HCL 50 MG/ML IJ SOLN: 12.5000 mg | INTRAMUSCULAR | Status: DC | PRN

## 2021-12-22 MED ORDER — PRENATAL MULTIVITAMIN CH
1.0000 | ORAL_TABLET | Freq: Every day | ORAL | Status: DC
  Administered 2021-12-23: 1 via ORAL
  Filled 2021-12-22: qty 1

## 2021-12-22 MED ORDER — OXYCODONE-ACETAMINOPHEN 5-325 MG PO TABS: 2.0000 | ORAL_TABLET | ORAL | Status: DC | PRN

## 2021-12-22 MED ORDER — MISOPROSTOL 200 MCG PO TABS
ORAL_TABLET | ORAL | Status: AC
  Filled 2021-12-22: qty 4

## 2021-12-22 MED ORDER — SOD CITRATE-CITRIC ACID 500-334 MG/5ML PO SOLN: 30.0000 mL | ORAL | Status: DC | PRN

## 2021-12-22 MED ORDER — SENNOSIDES-DOCUSATE SODIUM 8.6-50 MG PO TABS
2.0000 | ORAL_TABLET | ORAL | Status: DC
  Administered 2021-12-23 (×2): 2 via ORAL
  Filled 2021-12-22 (×2): qty 2

## 2021-12-22 MED ORDER — TERBUTALINE SULFATE 1 MG/ML IJ SOLN: 0.2500 mg | Freq: Once | INTRAMUSCULAR | Status: DC | PRN

## 2021-12-22 MED ORDER — DIBUCAINE (PERIANAL) 1 % EX OINT
1.0000 "application " | TOPICAL_OINTMENT | CUTANEOUS | Status: DC | PRN
  Filled 2021-12-22: qty 28

## 2021-12-22 MED ORDER — OXYTOCIN BOLUS FROM INFUSION
333.0000 mL | Freq: Once | INTRAVENOUS | Status: AC
  Administered 2021-12-22: 333 mL via INTRAVENOUS

## 2021-12-22 MED ORDER — LACTATED RINGERS IV SOLN: INTRAVENOUS | Status: DC

## 2021-12-22 MED ORDER — BUTORPHANOL TARTRATE 1 MG/ML IJ SOLN: 2.0000 mg | INTRAMUSCULAR | Status: DC | PRN

## 2021-12-22 MED ORDER — ONDANSETRON HCL 4 MG PO TABS: 4.0000 mg | ORAL_TABLET | ORAL | Status: DC | PRN

## 2021-12-22 MED ORDER — LACTATED RINGERS IV SOLN
500.0000 mL | Freq: Once | INTRAVENOUS | Status: AC
  Administered 2021-12-22: 500 mL via INTRAVENOUS

## 2021-12-22 MED ORDER — FENTANYL-BUPIVACAINE-NACL 0.5-0.125-0.9 MG/250ML-% EP SOLN
12.0000 mL/h | EPIDURAL | Status: DC | PRN
  Administered 2021-12-22: 12 mL/h via EPIDURAL

## 2021-12-22 NOTE — OB Triage Note (Signed)
Pt Erin Diaz 35 y.o. presents to labor and delivery triage reporting contractions q 2-3 mins. Pt is a G1P0000 at [redacted]w[redacted]d . Pt denies signs and symptoms consistent with rupture of membranes or active vaginal bleeding. Pt states positive fetal movement. External FM and TOCO applied to non-tender abdomen and assessing. Initial FHR 130. Vital signs obtained and within normal limits. Provider notified of pt.

## 2021-12-22 NOTE — Progress Notes (Signed)
° °  Subjective:  Feeling increasing intensity to contraction  Objective:   Vitals: Blood pressure 121/78, pulse 80, temperature 98.6 F (37 C), temperature source Oral, resp. rate 16, height 5\' 7"  (1.702 m), weight 105.2 kg, last menstrual period 02/21/2021. General: NAD Abdomen: gravid, non-tender Cervical Exam:  Dilation: 6 Effacement (%): 100 Cervical Position: Middle Station: -1 Presentation: Vertex Exam by:: Wonder Donaway MD  FHT: 130, moderate, +accels, no decels Toco: q3-83min  Results for orders placed or performed during the hospital encounter of 12/22/21 (from the past 24 hour(s))  CBC     Status: Abnormal   Collection Time: 12/22/21  4:22 AM  Result Value Ref Range   WBC 14.1 (H) 4.0 - 10.5 K/uL   RBC 4.13 3.87 - 5.11 MIL/uL   Hemoglobin 12.5 12.0 - 15.0 g/dL   HCT 12/24/21 (L) 22.2 - 97.9 %   MCV 86.4 80.0 - 100.0 fL   MCH 30.3 26.0 - 34.0 pg   MCHC 35.0 30.0 - 36.0 g/dL   RDW 89.2 11.9 - 41.7 %   Platelets 217 150 - 400 K/uL   nRBC 0.0 0.0 - 0.2 %  Type and screen [facility]     Status: None   Collection Time: 12/22/21  4:22 AM  Result Value Ref Range   ABO/RH(D) O NEG    Antibody Screen POS    Sample Expiration 12/25/2021,2359    Antibody Identification      PASSIVELY ACQUIRED ANTI-D Performed at [facility], [address] [place], [place]   RPR     Status: None   Collection Time: 12/22/21  4:22 AM  Result Value Ref Range   RPR Ser Ql NON REACTIVE NON REACTIVE  Rapid HIV screen (HIV 1/2 Ab+Ag) ([facility] Only)     Status: None   Collection Time: 12/22/21  4:22 AM  Result Value Ref Range   HIV-1 P24 Antigen - HIV24 NON REACTIVE NON REACTIVE   HIV 1/2 Antibodies NON REACTIVE NON REACTIVE   Interpretation (HIV Ag Ab)      A non reactive test result means that HIV 1 or HIV 2 antibodies and HIV 1 p24 antigen were not detected in the specimen.  ABO/Rh     Status: None   Collection Time: 12/22/21  6:15 AM  Result Value Ref  Range   ABO/RH(D)      O NEG Performed at [facility], [address] [place], [place]     Assessment:   35 y.o. G1P0000 [redacted]w[redacted]d term labor  Plan:   1) Labor  - no change over last check - IUPC placed, augmentation as needed with pitocin  2) Fetus - cat I tracing - total weight gain this pregnancy 20lbs, 11lbs in third trimester - 1-hr OGTT 124, with 1st trimester HgbA1C of 5.1  [redacted]w[redacted]d, MD, [facility], [facility] 12/22/2021, 2:43 PM

## 2021-12-22 NOTE — Progress Notes (Signed)
° °  Subjective:  Feeling contractions 6/10  Objective:   Vitals: Blood pressure 121/77, pulse 85, temperature 97.9 F (36.6 C), temperature source Oral, resp. rate 18, height 5\' 7"  (1.702 m), weight 105.2 kg, last menstrual period 02/21/2021. General: NAD Abdomen: gravid, non-tender Cervical Exam:  Dilation: 5 Effacement (%): 90 Cervical Position: Middle Station: -2 Presentation: Vertex Exam by:: Hung Rhinesmith  FHT: 13-. Moderate, +accels, no decels Toco:q80min  Results for orders placed or performed during the hospital encounter of 12/22/21 (from the past 24 hour(s))  CBC     Status: Abnormal   Collection Time: 12/22/21  4:22 AM  Result Value Ref Range   WBC 14.1 (H) 4.0 - 10.5 K/uL   RBC 4.13 3.87 - 5.11 MIL/uL   Hemoglobin 12.5 12.0 - 15.0 g/dL   HCT 12/24/21 (L) 83.3 - 82.5 %   MCV 86.4 80.0 - 100.0 fL   MCH 30.3 26.0 - 34.0 pg   MCHC 35.0 30.0 - 36.0 g/dL   RDW 05.3 97.6 - 73.4 %   Platelets 217 150 - 400 K/uL   nRBC 0.0 0.0 - 0.2 %  Type and screen [facility]     Status: None   Collection Time: 12/22/21  4:22 AM  Result Value Ref Range   ABO/RH(D) O NEG    Antibody Screen POS    Sample Expiration 12/25/2021,2359    Antibody Identification      PASSIVELY ACQUIRED ANTI-D Performed at [facility], [address] [place], [place]   Rapid HIV screen (HIV 1/2 Ab+Ag) ([facility] Only)     Status: None   Collection Time: 12/22/21  4:22 AM  Result Value Ref Range   HIV-1 P24 Antigen - HIV24 NON REACTIVE NON REACTIVE   HIV 1/2 Antibodies NON REACTIVE NON REACTIVE   Interpretation (HIV Ag Ab)      A non reactive test result means that HIV 1 or HIV 2 antibodies and HIV 1 p24 antigen were not detected in the specimen.  ABO/Rh     Status: None   Collection Time: 12/22/21  6:15 AM  Result Value Ref Range   ABO/RH(D)      12/24/21 NEG Performed at [facility], [address] [place], [place]     Assessment:   35 y.o.  G1P0000 [redacted]w[redacted]d term labor  Plan:   1) Labor - AROM clear.  May proceed with epidural if desired  2) Fetus - Cat I tracing  [redacted]w[redacted]d, MD, V[facility], [facility] 12/22/2021, 10:41 AM
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
## 2021-12-22 NOTE — Discharge Summary (Signed)
°    OB Discharge Summary     Patient Name: Erin Diaz DOB: [birth year] MRN: [id number]  Date of admission: 12/22/2021 Delivering MD: Vena Austria   Date of discharge: 12/24/2021  Admitting diagnosis: Uterine contractions during pregnancy [O47.9] Intrauterine pregnancy: [redacted]w[redacted]d     Secondary diagnosis:  Principal Problem:   Uterine contractions during pregnancy Active Problems:   Postpartum care following vaginal delivery  Additional problems: none     Discharge diagnosis: Term Pregnancy Delivered                                                                                                Post partum procedures:rhogam  Augmentation: AROM and Pitocin  Complications: None  Hospital course:  Onset of Labor With Vaginal Delivery      35 y.o. yo G1P1001 at [redacted]w[redacted]d was admitted in Latent Labor on 12/22/2021. Patient had an uncomplicated labor course as follows:  Membrane Rupture Time/Date: 10:37 AM ,12/22/2021   Delivery Method:Vaginal, Spontaneous  Episiotomy: None  Lacerations:  2nd degree  Patient had an uncomplicated postpartum course.  She is ambulating, tolerating a regular diet, passing flatus, and urinating well. Patient is discharged home in stable condition on 12/24/21.  Newborn Data: Birth date:12/22/2021  Birth time:8:12 PM  Gender:Female  Living status:Living  Apgars:8 ,9  Weight:3650 g   Physical exam  Vitals:   12/23/21 1533 12/23/21 1922 12/23/21 2331 12/24/21 0735  BP: 121/79 118/75 104/75 112/77  Pulse: (!) 104 (!) 109 100 99  Resp: 18 18 20 20   Temp: 98.3 F (36.8 C) 98.6 F (37 C) (!) 97.5 F (36.4 C) 97.8 F (36.6 C)  TempSrc: Oral Oral Oral Oral  SpO2: 99% 98% 99% 100%  Weight:      Height:       General: alert, cooperative, and no distress Lochia: appropriate Uterine Fundus: firm Incision: Healing well with no significant drainage DVT Evaluation: No evidence of DVT seen on physical exam. Negative Homan's sign. Labs: Lab Results   Component Value Date   WBC 14.0 (H) 12/23/2021   HGB 9.5 (L) 12/23/2021   HCT 28.5 (L) 12/23/2021   MCV 89.1 12/23/2021   PLT 184 12/23/2021   No flowsheet data found.  Discharge instruction: per After Visit Summary and "Baby and Me Booklet".  After visit meds:  Allergies as of 12/24/2021   No Known Allergies      Medication List    You have not been prescribed any medications.     Diet: routine diet  Activity: Advance as tolerated. Pelvic rest for 6 weeks.   Outpatient follow up:1 week Follow up Appt:in one and six weeks for PP follow up Follow up Visit:No follow-ups on file.  Postpartum contraception: IUD Mirena and may consider other method  Newborn Data: Live born female  Birth Weight:  8lbs APGAR: 8, 9  Newborn Delivery   Birth date/time: 12/22/2021 20:12:00 Delivery type: Vaginal, Spontaneous      Baby Feeding: Bottle and Breast- some lactation difficulties in the hospital. Disposition:home with mother   12/24/2021 02/21/2022, CNM

## 2021-12-22 NOTE — Anesthesia Preprocedure Evaluation (Signed)
Anesthesia Evaluation  Patient identified by MRN, date of birth, ID band Patient awake    Reviewed: Allergy & Precautions, NPO status , Patient's Chart, lab work & pertinent test results  History of Anesthesia Complications Negative for: history of anesthetic complications  Airway Mallampati: II  TM Distance: >3 FB Neck ROM: Full    Dental no notable dental hx. (+) Teeth Intact   Pulmonary neg pulmonary ROS, neg sleep apnea, neg COPD, Patient abstained from smoking.Not current smoker, former smoker,    Pulmonary exam normal breath sounds clear to auscultation       Cardiovascular Exercise Tolerance: Good METS(-) hypertension(-) CAD and (-) Past MI negative cardio ROS  (-) dysrhythmias  Rhythm:Regular Rate:Normal - Systolic murmurs    Neuro/Psych negative neurological ROS  negative psych ROS   GI/Hepatic neg GERD  ,(+)     (-) substance abuse  ,   Endo/Other  neg diabetes  Renal/GU negative Renal ROS     Musculoskeletal   Abdominal   Peds  Hematology   Anesthesia Other Findings Past Medical History: No date: No pertinent past medical history  Reproductive/Obstetrics (+) Pregnancy                             Anesthesia Physical Anesthesia Plan  ASA: 2  Anesthesia Plan: Epidural   Post-op Pain Management:    Induction:   PONV Risk Score and Plan: 2 and Treatment may vary due to age or medical condition and Ondansetron  Airway Management Planned: Natural Airway  Additional Equipment:   Intra-op Plan:   Post-operative Plan:   Informed Consent: I have reviewed the patients History and Physical, chart, labs and discussed the procedure including the risks, benefits and alternatives for the proposed anesthesia with the patient or authorized representative who has indicated his/her understanding and acceptance.       Plan Discussed with: Surgeon  Anesthesia Plan Comments:  (Discussed R/B/A of neuraxial anesthesia technique with patient: - rare risks of spinal/epidural hematoma, nerve damage, infection - Risk of PDPH - Risk of itching - Risk of nausea and vomiting - Risk of poor block necessitating replacement of epidural. - Risk of allergic reactions. Patient voiced understanding.)        Anesthesia Quick Evaluation

## 2021-12-22 NOTE — Progress Notes (Signed)
°   12/22/21 0136  Fetal Heart Rate A  Mode External  Baseline Rate (A) 130 bpm  Variability 6-25 BPM  Accelerations 15 x 15  Decelerations None  Uterine Activity  Mode Toco  Contraction Frequency (min) 1.5-3  Contraction Duration (sec) 60-90  Contraction Quality Moderate  Resting Tone Palpated Relaxed  Resting Time Adequate   Category 1 NST.

## 2021-12-22 NOTE — Progress Notes (Signed)
° °  Subjective:  Comfortable epidural in place  Objective:   Vitals: Blood pressure 126/76, pulse 83, temperature 98.4 F (36.9 C), temperature source Oral, resp. rate 16, height 5\' 7"  (1.702 m), weight 105.2 kg, last menstrual period 02/21/2021, SpO2 98 %. General: NAD Abdomen: gravid, non-tender Cervical Exam:  Dilation: Lip/rim Effacement (%): 100 Cervical Position: Middle Station: Plus 1, Plus 2 Presentation: Vertex Exam by:: Zaleigh Bermingham MD  FHT: 140, moderate, +accels, no decels Toco:q 2-70min  Results for orders placed or performed during the hospital encounter of 12/22/21 (from the past 24 hour(s))  CBC     Status: Abnormal   Collection Time: 12/22/21  4:22 AM  Result Value Ref Range   WBC 14.1 (H) 4.0 - 10.5 K/uL   RBC 4.13 3.87 - 5.11 MIL/uL   Hemoglobin 12.5 12.0 - 15.0 g/dL   HCT 12/24/21 (L) 63.8 - 93.7 %   MCV 86.4 80.0 - 100.0 fL   MCH 30.3 26.0 - 34.0 pg   MCHC 35.0 30.0 - 36.0 g/dL   RDW 34.2 87.6 - 81.1 %   Platelets 217 150 - 400 K/uL   nRBC 0.0 0.0 - 0.2 %  Type and screen [facility]     Status: None   Collection Time: 12/22/21  4:22 AM  Result Value Ref Range   ABO/RH(D) O NEG    Antibody Screen POS    Sample Expiration 12/25/2021,2359    Antibody Identification      PASSIVELY ACQUIRED ANTI-D Performed at [facility], [address] [place], [place]   RPR     Status: None   Collection Time: 12/22/21  4:22 AM  Result Value Ref Range   RPR Ser Ql NON REACTIVE NON REACTIVE  Rapid HIV screen (HIV 1/2 Ab+Ag) ([facility] Only)     Status: None   Collection Time: 12/22/21  4:22 AM  Result Value Ref Range   HIV-1 P24 Antigen - HIV24 NON REACTIVE NON REACTIVE   HIV 1/2 Antibodies NON REACTIVE NON REACTIVE   Interpretation (HIV Ag Ab)      A non reactive test result means that HIV 1 or HIV 2 antibodies and HIV 1 p24 antigen were not detected in the specimen.  ABO/Rh     Status: None   Collection Time: 12/22/21  6:15 AM   Result Value Ref Range   ABO/RH(D)      O NEG Performed at [facility], [address] [place], [place]     Assessment:   35 y.o. G1P0000 [redacted]w[redacted]d term labor  Plan:   1) Labor  - continue to titrate pitocin - good cervical change since starting pitocin  2) Fetus - cat I tracing    [redacted]w[redacted]d, MD, Vena Austria OB/[facility], [facility] 12/22/2021, 6:16 PM

## 2021-12-22 NOTE — Anesthesia Procedure Notes (Signed)
Epidural Patient location during procedure: OB Start time: 12/22/2021 5:05 PM End time: 12/22/2021 5:30 PM  Staffing Anesthesiologist: Corinda Gubler, MD Performed: anesthesiologist   Preanesthetic Checklist Completed: patient identified, IV checked, site marked, risks and benefits discussed, surgical consent, monitors and equipment checked, pre-op evaluation and timeout performed  Epidural Patient position: sitting Prep: ChloraPrep Patient monitoring: heart rate, continuous pulse ox and blood pressure Approach: midline Location: L3-L4 Injection technique: LOR saline  Needle:  Needle type: Tuohy  Needle gauge: 17 G Needle length: 9 cm Needle insertion depth: 6.5 cm Catheter type: closed end flexible Catheter size: 19 Gauge Catheter at skin depth: 12 cm Test dose: negative and 1.5% lidocaine with Epi 1:200 K  Assessment Sensory level: T10 Events: blood not aspirated, injection not painful, no injection resistance, no paresthesia and negative IV test  Additional Notes first attempt Pt. Evaluated and documentation done after procedure finished. Patient identified. Risks/Benefits/Options discussed with patient including but not limited to bleeding, infection, nerve damage, paralysis, failed block, incomplete pain control, headache, blood pressure changes, nausea, vomiting, reactions to medication both or allergic, itching and postpartum back pain. Confirmed with bedside nurse the patient's most recent platelet count. Confirmed with patient that they are not currently taking any anticoagulation, have any bleeding history or any family history of bleeding disorders. Patient expressed understanding and wished to proceed. All questions were answered. Sterile technique was used throughout the entire procedure. Please see nursing notes for vital signs. Test dose was given through epidural catheter and negative prior to continuing to dose epidural or start infusion. Warning signs of high block  given to the patient including shortness of breath, tingling/numbness in hands, complete motor block, or any concerning symptoms with instructions to call for help. Patient was given instructions on fall risk and not to get out of bed. All questions and concerns addressed with instructions to call with any issues or inadequate analgesia.     Patient tolerated the insertion well without immediate complications.  Reason for block: procedure for painReason for block:procedure for pain

## 2021-12-23 ENCOUNTER — Encounter: Payer: Self-pay | Admitting: Obstetrics and Gynecology

## 2021-12-23 LAB — CBC
HCT: 28.5 % — ABNORMAL LOW (ref 36.0–46.0)
Hemoglobin: 9.5 g/dL — ABNORMAL LOW (ref 12.0–15.0)
MCH: 29.7 pg (ref 26.0–34.0)
MCHC: 33.3 g/dL (ref 30.0–36.0)
MCV: 89.1 fL (ref 80.0–100.0)
Platelets: 184 10*3/uL (ref 150–400)
RBC: 3.2 MIL/uL — ABNORMAL LOW (ref 3.87–5.11)
RDW: 14.6 % (ref 11.5–15.5)
WBC: 14 10*3/uL — ABNORMAL HIGH (ref 4.0–10.5)
nRBC: 0 % (ref 0.0–0.2)

## 2021-12-23 LAB — FETAL SCREEN: Fetal Screen: NEGATIVE

## 2021-12-23 MED ORDER — RHO D IMMUNE GLOBULIN 1500 UNIT/2ML IJ SOSY
300.0000 ug | PREFILLED_SYRINGE | Freq: Once | INTRAMUSCULAR | Status: AC
  Administered 2021-12-23: 300 ug via INTRAVENOUS
  Filled 2021-12-23: qty 2

## 2021-12-23 NOTE — Anesthesia Postprocedure Evaluation (Signed)
Anesthesia Post Note  Patient: Erin Diaz  Procedure(s) Performed: AN AD HOC LABOR EPIDURAL  Patient location during evaluation: Mother Baby Anesthesia Type: Epidural Level of consciousness: awake and alert Pain management: pain level controlled Vital Signs Assessment: post-procedure vital signs reviewed and stable Respiratory status: spontaneous breathing, nonlabored ventilation and respiratory function stable Cardiovascular status: stable Postop Assessment: no headache, no backache and epidural receding Anesthetic complications: no   No notable events documented.   Last Vitals:  Vitals:   12/23/21 0459 12/23/21 0738  BP: 108/80 107/72  Pulse: 77 79  Resp: 18 18  Temp: 36.6 C 36.7 C  SpO2: 100% 98%    Last Pain:  Vitals:   12/23/21 0800  TempSrc:   PainSc: 0-No pain                 Iran Ouch

## 2021-12-23 NOTE — Progress Notes (Signed)
Subjective:  Erin Diaz is doing well. Pain well managed with Ibuprofen. Has been up to void. Appetite is returning. 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 Partner present for support.   Objective:  Vital signs in last 24 hours: Temp:  [97.8 F (36.6 C)-99.3 F (37.4 C)] 98 F (36.7 C) (01/01 0733) Pulse Rate:  [77-146] 79 (01/01 0733) Resp:  [16-18] 18 (01/01 0733) BP: (95-138)/(52-88) 107/72 (01/01 0733) SpO2:  [96 %-100 %] 98 % (01/01 0733)    General: NAD Breast: soft, no redness or masses, Nipples everted and intact bilaterally  Pulmonary: no increased work of breathing Abdomen: non-distended, non-tender, fundus firm at level of umbilicus Perineum: well approximated, edematous  Extremities: no edema, no erythema, no tenderness  Results for orders placed or performed during the hospital encounter of 12/22/21 (from the past 72 hour(s))  CBC     Status: Abnormal   Collection Time: 12/22/21  4:22 AM  Result Value Ref Range   WBC 14.1 (H) 4.0 - 10.5 K/uL   RBC 4.13 3.87 - 5.11 MIL/uL   Hemoglobin 12.5 12.0 - 15.0 g/dL   HCT 37.1 (L) 06.2 - 69.4 %   MCV 86.4 80.0 - 100.0 fL   MCH 30.3 26.0 - 34.0 pg   MCHC 35.0 30.0 - 36.0 g/dL   RDW 85.4 62.7 - 03.5 %   Platelets 217 150 - 400 K/uL   nRBC 0.0 0.0 - 0.2 %    Comment: Performed at [facility], [address] [place], [place] [address]  Type and screen [facility]     Status: None   Collection Time: 12/22/21  4:22 AM  Result Value Ref Range   ABO/RH(D) O NEG    Antibody Screen POS    Sample Expiration 12/25/2021,2359    Antibody Identification      PASSIVELY ACQUIRED ANTI-D Performed at [facility], [address] [place], [place] [address]   RPR     Status: None   Collection Time: 12/22/21  4:22 AM  Result Value Ref Range   RPR Ser Ql NON REACTIVE NON REACTIVE    Comment: Performed at [facility], [address] [place],  [place] [address]  Rapid HIV screen (HIV 1/2 Ab+Ag) ([facility] Only)     Status: None   Collection Time: 12/22/21  4:22 AM  Result Value Ref Range   HIV-1 P24 Antigen - HIV24 NON REACTIVE NON REACTIVE    Comment: (NOTE) Detection of p24 may be inhibited by biotin in the sample, causing false negative results in acute infection.    HIV 1/2 Antibodies NON REACTIVE NON REACTIVE   Interpretation (HIV Ag Ab)      A non reactive test result means that HIV 1 or HIV 2 antibodies and HIV 1 p24 antigen were not detected in the specimen.    Comment: Performed at [facility], [address] [place], [place] [address]  ABO/Rh     Status: None   Collection Time: 12/22/21  6:15 AM  Result Value Ref Range   ABO/RH(D)      [place] NEG Performed at [facility], [address] [place] [place], [place] [address]   Fetal screen     Status: None   Collection Time: 12/23/21  7:18 AM  Result Value Ref Range   Fetal Screen      NEG Performed at [facility], [address] [place], [place] [address]  Rhogam injection     Status: None (Preliminary result)   Collection Time: 12/23/21  7:18 AM  Result Value Ref Range   Unit Number [id number]    Blood Component Type RHIG    Unit division 00    Status of Unit ALLOCATED    Transfusion Status OK TO TRANSFUSE   CBC     Status: Abnormal   Collection Time: 12/23/21  7:18 AM  Result Value Ref Range   WBC 14.0 (H) 4.0 - 10.5 K/uL   RBC 3.20 (L) 3.87 - 5.11 MIL/uL   Hemoglobin 9.5 (L) 12.0 - 15.0 g/dL   HCT 67.6 (L) 19.5 - 09.3 %   MCV 89.1 80.0 - 100.0 fL   MCH 29.7 26.0 - 34.0 pg   MCHC 33.3 30.0 - 36.0 g/dL   RDW 26.7 12.4 - 58.0 %   Platelets 184 150 - 400 K/uL   nRBC 0.0 0.0 - 0.2 %    Comment: Performed at [facility], [address], [place], [place] [address]    Assessment:   36 y.o. G1P1001 postpartum day # 1  Plan:    1) Acute blood loss anemia - hemodynamically stable and asymptomatic - po ferrous sulfate  2)  Blood Type --/--/O NEG Performed at [facility], [address] [place], [place] [address]  [phone]) / Ishmael Holter 5.21 (05/27 1130) / Varicella Immune Infant O positive, needs Rhogam   3) TDAP status up to date  4) Feeding plan breast  5)  Education given regarding options for contraception, as well as compatibility with breast feeding if applicable.  Patient plans on  undecided  for contraception-will review at [facility] visit  6) Disposition, plan for discharge after 24 hour cares are complete.   Carie Caddy, CNM  [facility], [facility] 12/23/2021, 9:46 AM

## 2021-12-23 NOTE — Progress Notes (Signed)
Brief note:  Thu remaining in hospital overnight-desires [place] assistance.  Infant to have circumcision in the am. Erin Diaz currently using breast pump.   Plan discharge tomorrow   [name], CNM  [facility], [facility]  @TODAY @  6:31 PM

## 2021-12-24 LAB — RHOGAM INJECTION: Unit division: 0

## 2021-12-24 NOTE — Lactation Note (Signed)
This note was copied from a baby's chart. Lactation Consultation Note  Patient Name: Erin Diaz [id number] Date: 12/24/2021 Reason for consult: Initial assessment;Primapara;Term;Other (Comment) (slight recessed chin) Age:34 hours  Initial lactation visit. Mom is P1, SVD 39hours ago and is anticipating discharge today. Mom desired breastfeeding but has been having difficulty with latching due to suspected recessed chin. Mom has been given a nipple shield to assist with feedings/latching, and set up with DEBP at bedside for continued stimulation.  Baby received a circumcision early this morning and has been sleeping and disinterested in feeding since. Reassurance given that this is normal. Mom asks for advice on continuing with BF efforts. Tips given: -Catching early cues (early cues reviewed) -Offering breast first with nipple shield in semi reclined position or modified football position to help with chin hitting breast first  --Tip given to also place some EBM or formula into shield to help encourage a sustained latch. -Provide supplement as appropriate and pump post feedings (minimum every 3 hours) to help establish and sustain appropriate supply. Parents verbalize understanding of tips/strategies and plans to continue with current efforts.  LC also encouraged family to reach out in 2-3 days for outpatient appointment to continue working on breastfeeding if desired or at any time post discharge.  Maternal Data Has patient been taught Hand Expression?: Yes Does the patient have breastfeeding experience prior to this delivery?: No  Feeding Mother's Current Feeding Choice: Breast Milk  LATCH Score                    Lactation Tools Discussed/Used Tools: Pump;Nipple Shields Nipple shield size: 24 Breast pump type: Double-Electric Breast Pump Reason for Pumping: stimulation Pumping frequency: q 2-3 hours  Interventions Interventions: Breast feeding basics reviewed;Hand  express;Skin to skin;Hand pump;DEBP;Education  Discharge Discharge Education: Engorgement and breast care;Warning signs for feeding baby;Outpatient recommendation Pump: Personal  Consult Status Consult Status: Complete    Lavonia Drafts 12/24/2021, 11:25 AM

## 2021-12-24 NOTE — Progress Notes (Signed)
Pt discharged with infant.  Discharge instructions, prescriptions and follow up appointment given to and reviewed with pt. Pt verbalized understanding. Escorted out by auxillary. 

## 2021-12-30 ENCOUNTER — Emergency Department: Payer: BC Managed Care – PPO

## 2021-12-30 ENCOUNTER — Emergency Department
Admission: EM | Admit: 2021-12-30 | Discharge: 2021-12-31 | Disposition: A | Discharge status: Home / Self Care | Payer: BC Managed Care – PPO | Attending: Emergency Medicine | Admitting: Emergency Medicine

## 2021-12-30 ENCOUNTER — Other Ambulatory Visit: Payer: Self-pay

## 2021-12-30 DIAGNOSIS — Z20822 Contact with and (suspected) exposure to covid-19: Secondary | ICD-10-CM | POA: Diagnosis not present

## 2021-12-30 DIAGNOSIS — R0981 Nasal congestion: Secondary | ICD-10-CM | POA: Diagnosis not present

## 2021-12-30 DIAGNOSIS — R0789 Other chest pain: Secondary | ICD-10-CM | POA: Diagnosis not present

## 2021-12-30 DIAGNOSIS — R0602 Shortness of breath: Secondary | ICD-10-CM | POA: Insufficient documentation

## 2021-12-30 DIAGNOSIS — Z0389 Encounter for observation for other suspected diseases and conditions ruled out: Secondary | ICD-10-CM | POA: Diagnosis not present

## 2021-12-30 DIAGNOSIS — R079 Chest pain, unspecified: Secondary | ICD-10-CM | POA: Diagnosis not present

## 2021-12-30 LAB — CBC
HCT: 29.7 % — ABNORMAL LOW (ref 36.0–46.0)
Hemoglobin: 9.9 g/dL — ABNORMAL LOW (ref 12.0–15.0)
MCH: 29.8 pg (ref 26.0–34.0)
MCHC: 33.3 g/dL (ref 30.0–36.0)
MCV: 89.5 fL (ref 80.0–100.0)
Platelets: 446 10*3/uL — ABNORMAL HIGH (ref 150–400)
RBC: 3.32 MIL/uL — ABNORMAL LOW (ref 3.87–5.11)
RDW: 14.7 % (ref 11.5–15.5)
WBC: 10.5 10*3/uL (ref 4.0–10.5)
nRBC: 0 % (ref 0.0–0.2)

## 2021-12-30 LAB — BASIC METABOLIC PANEL
Anion gap: 8 (ref 5–15)
BUN: 7 mg/dL (ref 6–20)
CO2: 24 mmol/L (ref 22–32)
Calcium: 8.9 mg/dL (ref 8.9–10.3)
Chloride: 109 mmol/L (ref 98–111)
Creatinine, Ser: 0.65 mg/dL (ref 0.44–1.00)
GFR, Estimated: >60 mL/min (ref >60)
Glucose, Bld: 105 mg/dL — ABNORMAL HIGH (ref 70–99)
Potassium: 3.2 mmol/L — ABNORMAL LOW (ref 3.5–5.1)
Sodium: 141 mmol/L (ref 135–145)

## 2021-12-30 LAB — TROPONIN I (HIGH SENSITIVITY)
Troponin I (High Sensitivity): 4 ng/L (ref <18)
Troponin I (High Sensitivity): 5 ng/L (ref <18)

## 2021-12-30 NOTE — ED Triage Notes (Signed)
Pt postpartum since Saturday- about 3 hours ago pt c/o chest pain that has been increasing in sharpness. Pt c/o dizziness, mild SOB, congestion, and low appetite for last two weeks. Pt denies cardiac history. Pt is AOX4, NAD noted. Nasal congestion noted.

## 2021-12-31 ENCOUNTER — Emergency Department: Payer: BC Managed Care – PPO

## 2021-12-31 DIAGNOSIS — Z0389 Encounter for observation for other suspected diseases and conditions ruled out: Secondary | ICD-10-CM | POA: Diagnosis not present

## 2021-12-31 LAB — RESP PANEL BY RT-PCR (FLU A&B, COVID) ARPGX2
Influenza A by PCR: NEGATIVE
Influenza B by PCR: NEGATIVE
SARS Coronavirus 2 by RT PCR: NEGATIVE

## 2021-12-31 IMAGING — CT CT ANGIO CHEST
2 of 7 series · 18 of 46 positions shown · IV contrast (APPLIED)
Comparison: Chest radiograph dated [DATE].

CLINICAL DATA: Concern for pulmonary embolism.

EXAM:
CT ANGIOGRAPHY CHEST WITH CONTRAST
TECHNIQUE: Multidetector CT imaging of the chest was performed using the
standard protocol during bolus administration of intravenous
contrast. Multiplanar CT image reconstructions and MIPs were
obtained to evaluate the vascular anatomy.
CONTRAST:  75mL OMNIPAQUE IOHEXOL 350 MG/ML SOLN

[Series 5: thins · axial · 0.91mm/px · z∈[-633,-377]mm · 15 of 354 slices shown]
[im 17/354  lung]
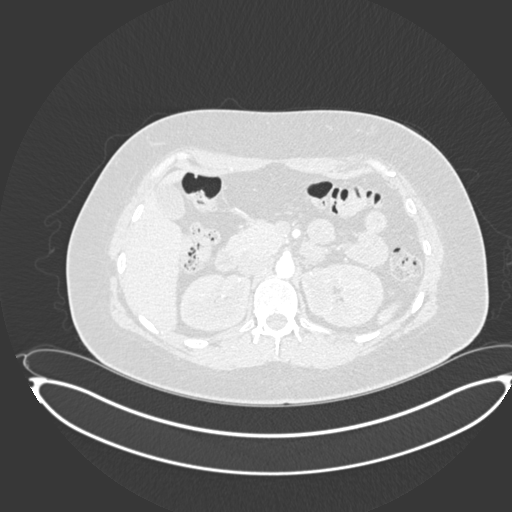
[im 51/354  soft-tissue]
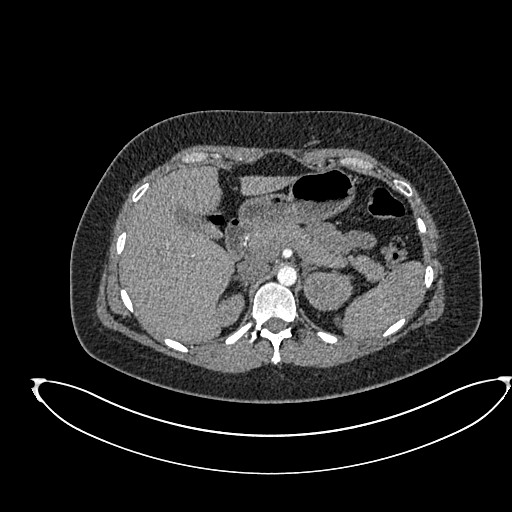
[im 68/354  lung]
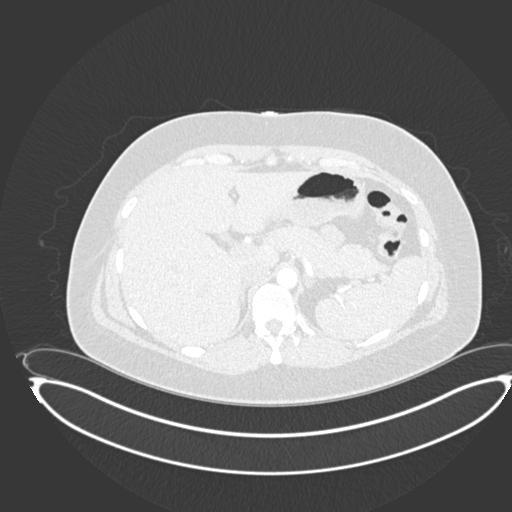
[im 85/354  soft-tissue]
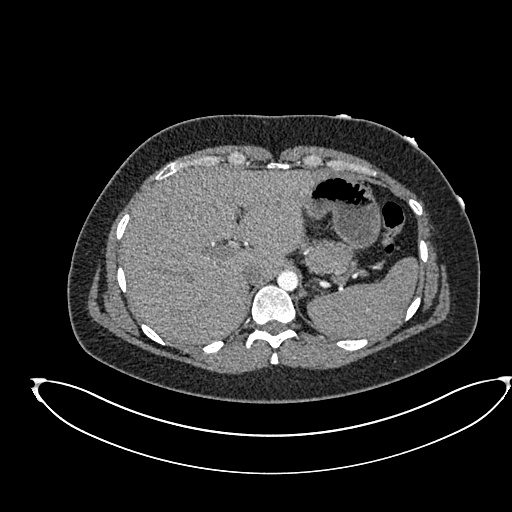
[im 118/354  lung]
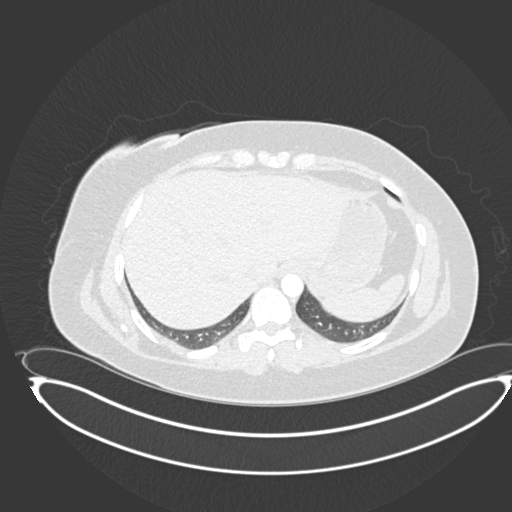
[im 135/354  soft-tissue]
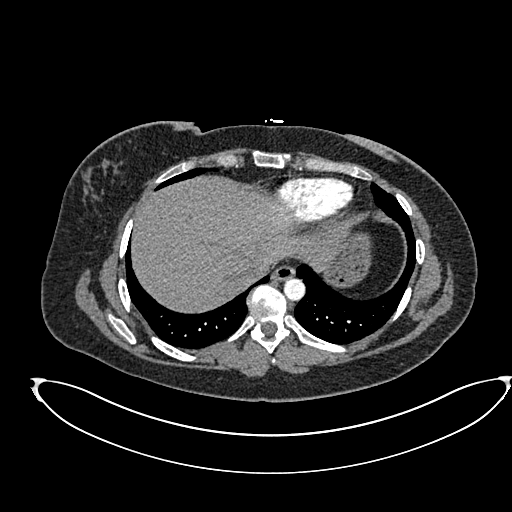
[im 152/354  lung]
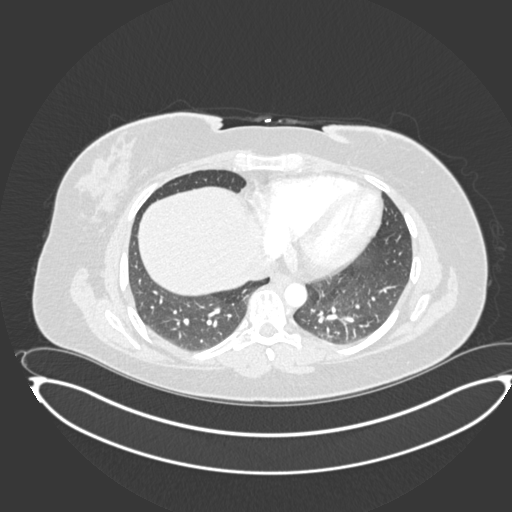
[im 185/354  soft-tissue]
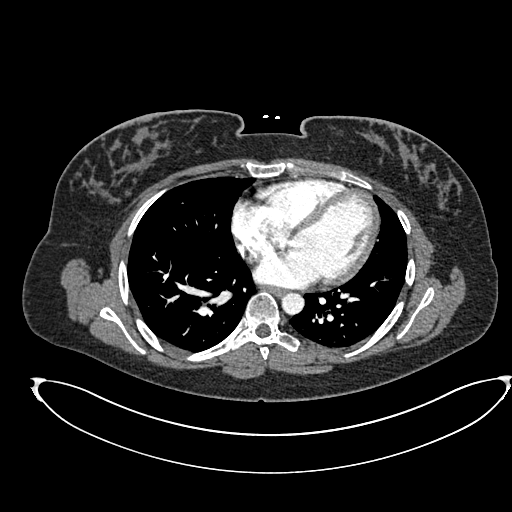
[im 202/354  lung]
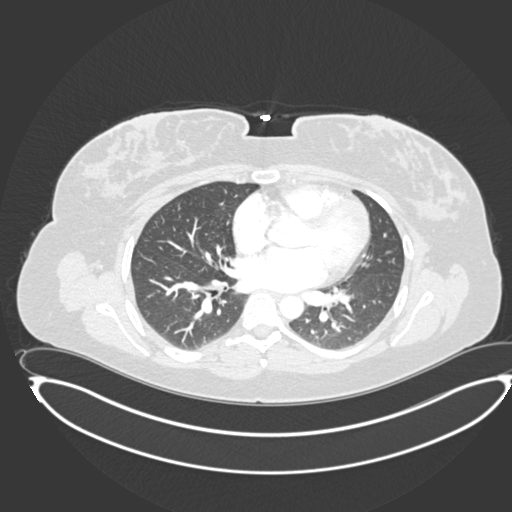
[im 219/354  soft-tissue]
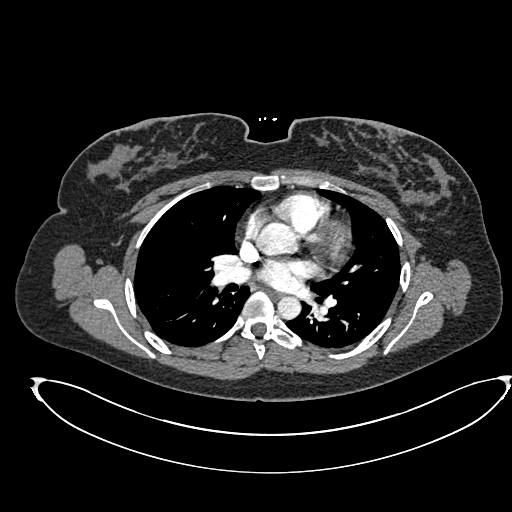
[im 253/354  lung]
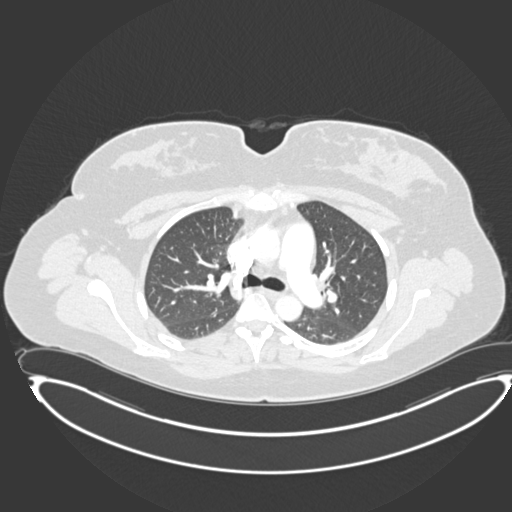
[im 269/354  soft-tissue]
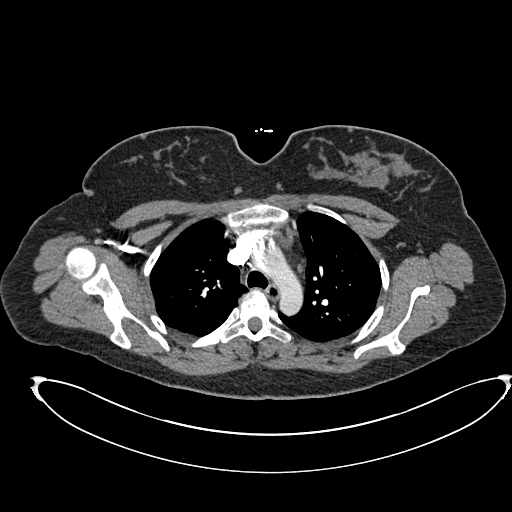
[im 286/354  lung]
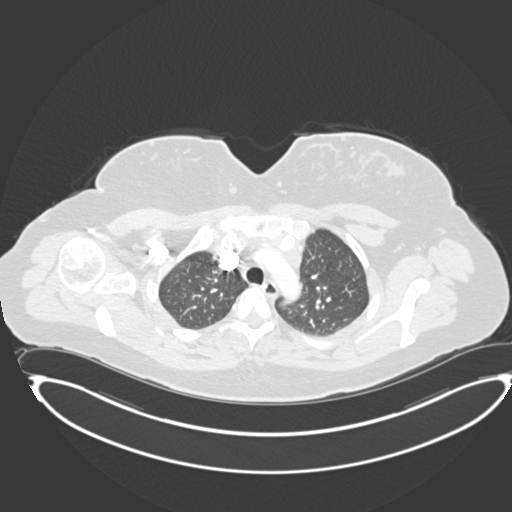
[im 320/354  soft-tissue]
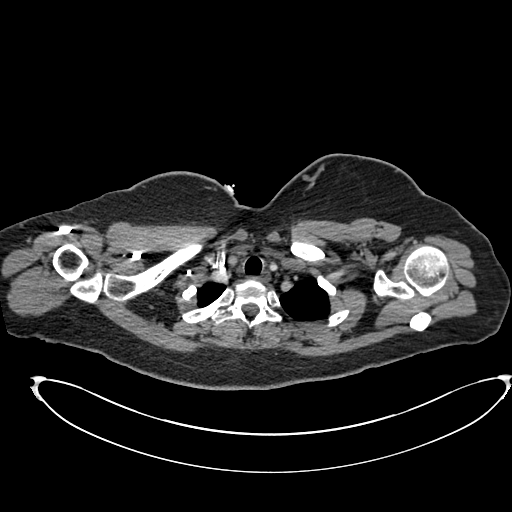
[im 337/354  lung]
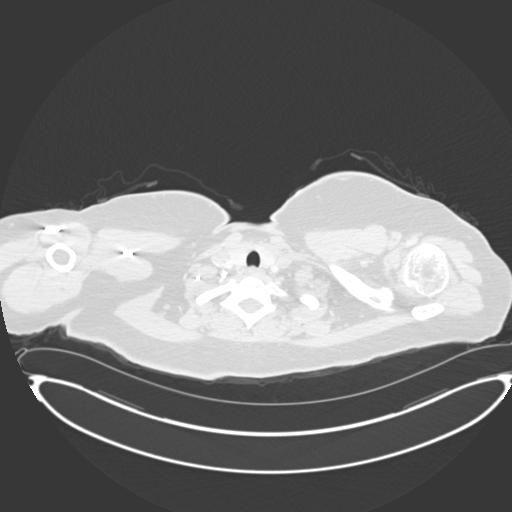

[Series 7: coronal mpr · coronal · 0.55mm/px · 3 of 89 slices shown]
[im 23/89  soft-tissue]
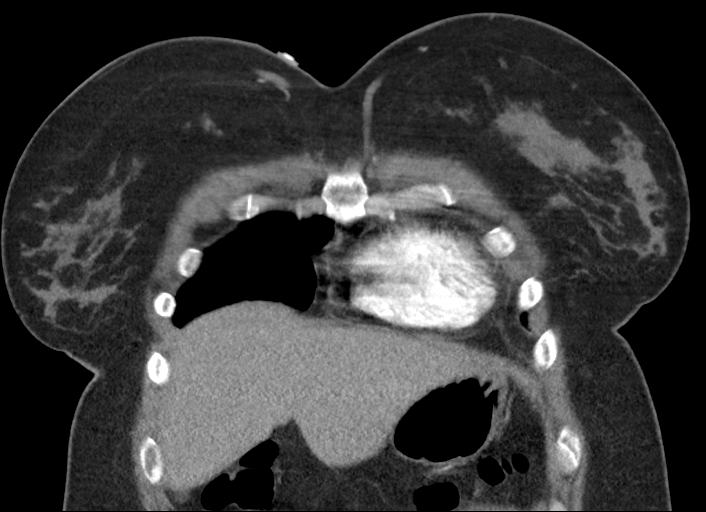
[im 45/89  soft-tissue]
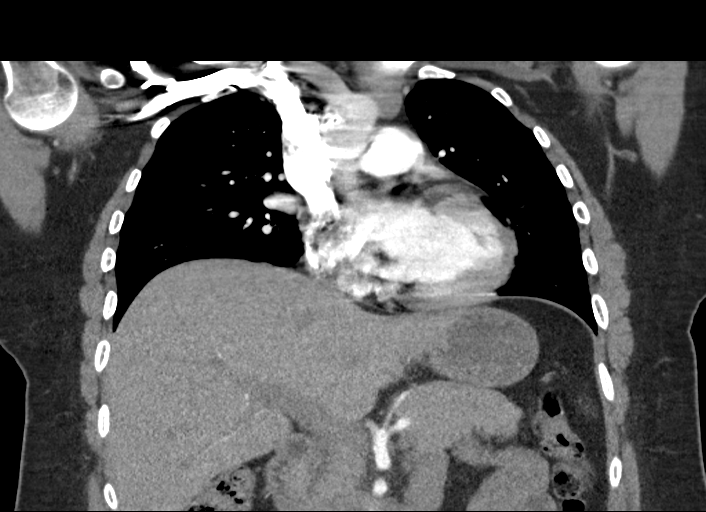
[im 67/89  soft-tissue]
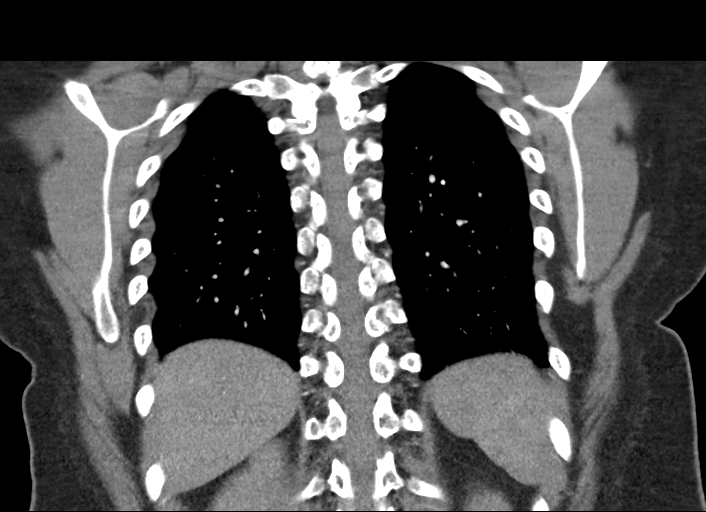

[18 of 46 positions shown; findings below may reference images not displayed]

FINDINGS: Cardiovascular: There is no cardiomegaly or pericardial effusion.
The thoracic aorta is unremarkable. No pulmonary artery embolus
identified.

Mediastinum/Nodes: No hilar or mediastinal adenopathy. The esophagus
and thyroid gland are grossly unremarkable no mediastinal fluid
collection.

Lungs/Pleura: No focal consolidation, pleural effusion,
pneumothorax. The central airways are patent.

Upper Abdomen: No acute abnormality.

Musculoskeletal: No chest wall abnormality. No acute or significant
osseous findings.

Review of the MIP images confirms the above findings.
IMPRESSION: No acute intrathoracic pathology. No CT evidence of pulmonary
embolism.

## 2021-12-31 MED ORDER — IOHEXOL 350 MG/ML SOLN
75.0000 mL | Freq: Once | INTRAVENOUS | Status: AC | PRN
  Administered 2021-12-31: 75 mL via INTRAVENOUS
  Filled 2021-12-31: qty 75

## 2021-12-31 NOTE — ED Provider Notes (Signed)
[facility] Provider Note    Event Date/Time   First MD Initiated Contact with Patient 12/31/21 [phone]     (approximate)  History   Chief Complaint: Chest Pain  HPI  Erin Diaz is a 36 y.o. female approximately 3 days postpartum who presents to the emergency department for chest pain.  According to the patient for the last 2 or 3 days she has been feeling somewhat short of breath at times and has been experiencing intermittent sharp pains to the center of her chest.  Patient also states mild congestion over this time period as well.  Patient states the sharp chest pain became somewhat worse today which concerned her so she came to the emergency department for evaluation.  Denies any chest pain currently.  Physical Exam   Triage Vital Signs: ED Triage Vitals  Enc Vitals Group     BP 12/30/21 2015 122/77     Pulse Rate 12/30/21 2015 87     Resp 12/30/21 2015 18     Temp 12/30/21 2015 98.6 F (37 C)     Temp Source 12/30/21 2015 Oral     SpO2 12/30/21 2015 96 %     Weight --      Height --      Head Circumference --      Peak Flow --      Pain Score 12/30/21 2013 4     Pain Loc --      Pain Edu? --      Excl. in GC? --     Most recent vital signs: Vitals:   12/30/21 2015  BP: 122/77  Pulse: 87  Resp: 18  Temp: 98.6 F (37 C)  SpO2: 96%    General: Awake, no distress.  CV:  Good peripheral perfusion.  Regular rate and rhythm  Resp:  Normal effort.  Equal breath sounds bilaterally.  Abd:  No distention.  Soft, nontender.  No rebound or guarding. Other:     ED Results / Procedures / Treatments   EKG  EKG viewed and interpreted by myself shows normal sinus rhythm 86 bpm with a narrow QRS, normal axis, normal intervals, no concerning ST changes.  RADIOLOGY  I personally reviewed the chest x-ray images.  No acute finding my examination. Radiology is read the chest x-ray is negative. CTA of the chest negative.   MEDICATIONS ORDERED  IN ED: Medications - No data to display   IMPRESSION / MDM / ASSESSMENT AND PLAN / ED COURSE  I reviewed the triage vital signs and the nursing notes.  Patient presents emergency department for intermittent sharp chest pains over the past several days including earlier today.  States mild shortness of breath at times as well as mild congestion over the past week or so.  No fever no cough.  Patient is 3 days postpartum from an NSVD.  Overall the patient appears well, lab work is reassuring including negative troponin.  COVID/flu is negative as well.  Chest x-ray is clear and EKG is reassuring.  However given the patient's recent postpartum state along with her description of sharp chest pains and her increased risk for thromboembolism we will obtain a CTA of the chest to rule out pulmonary embolism or other intrathoracic abnormality.  Patient CTA of the chest is negative for PE or other intra thoracic pathology.  Given the patient's reassuring work-up I believe she is safe for discharge home.  I discussed with the patient follow-up with her OB/GYN as  well as return precautions for return of/worsening chest pain or any shortness of breath.  Patient agreeable to plan of care.  FINAL CLINICAL IMPRESSION(S) / ED DIAGNOSES   Chest pain  Note:  This document was prepared using Dragon voice recognition software and may include unintentional dictation errors.   Minna Antis, MD 12/31/21 [phone]

## 2022-01-03 ENCOUNTER — Ambulatory Visit (INDEPENDENT_AMBULATORY_CARE_PROVIDER_SITE_OTHER): Payer: BC Managed Care – PPO | Admitting: Obstetrics and Gynecology

## 2022-01-03 ENCOUNTER — Other Ambulatory Visit: Payer: Self-pay

## 2022-01-03 NOTE — Progress Notes (Signed)
Obstetrics & Gynecology Office Visit   Chief Complaint:  Chief Complaint  Patient presents with   Vaginal Bleeding    History of Present Illness: 36 y.o. G1P1001 s/p TSVD 12/22/2021 who noted some increased bleeding and discomfort at her laceration site (2nd degree).  No fevers or chills.  Placental delivery spontaneous and intact.  She denies abdominal pain or cramping.  Newborn currently admitted to NICU for RSV   Review of Systems: Review of Systems  Constitutional: Negative.   Respiratory:  Positive for cough.   Genitourinary:  Positive for dysuria. Negative for flank pain, frequency, hematuria and urgency.  Neurological:  Negative for headaches.    Past Medical History:  Past Medical History:  Diagnosis Date   No pertinent past medical history     Past Surgical History:  Past Surgical History:  Procedure Laterality Date   NO PAST SURGERIES      Gynecologic History: No LMP recorded.  Obstetric History: G1P1001  Family History:  No family history on file.  Social History:  Social History   Socioeconomic History   Marital status: Significant Other    Spouse name: Erlene Quan   Number of children: Not on file   Years of education: Not on file   Highest education level: Not on file  Occupational History   Occupation: cambro Psychologist, educational  Tobacco Use   Smoking status: Former    Types: Cigarettes    Quit date: 11/22/2016    Years since quitting: 5.1   Smokeless tobacco: Never  Vaping Use   Vaping Use: Never used  Substance and Sexual Activity   Alcohol use: Yes    Comment: Rerely   Drug use: Never   Sexual activity: Yes    Partners: Male    Birth control/protection: None  Other Topics Concern   Not on file  Social History Narrative   Not on file   Social Determinants of Health   Financial Resource Strain: Not on file  Food Insecurity: Not on file  Transportation Needs: Not on file  Physical Activity: Not on file  Stress: Not on file  Social  Connections: Not on file  Intimate Partner Violence: Not on file    Allergies:  No Known Allergies  Medications: Prior to Admission medications   Not on File    Physical Exam Vitals:  Vitals:   01/03/22 1331  BP: 120/70  Pulse: 98  SpO2: 98%   No LMP recorded.  General: NAD HEENT: normocephalic, anicteric Pulmonary: No increased work of breathing Genitourinary:  External: Normal external female genitalia.  Normal urethral meatus, normal Bartholin's and Skene's glands.  Suture material remains present and intact with 3-0 Monocryl knot visible.  The 2nd degree remains well approximated however the left labial aspect appears to have pulled through the sutures.  Appropriate lochia noted.    Vagina: Normal vaginal mucosa  Rectal: deferred Extremities: no edema, erythema, or tenderness Neurologic: Grossly intact Psychiatric: mood appropriate, affect full  Female chaperone present for pelvic  portions of the physical exam  Assessment: 36 y.o. G1P1001 perineal laceration  Plan: Problem List Items Addressed This Visit   None Visit Diagnoses     Perineal laceration during delivery, delivered    -  Primary      1) Laceration - healing appropriately sutures remain present. The left aspect which had a small superficial laceration has pulled through the sutures and will need to heal by secondary intention.  Patient has been coughing some which she attributes to  potentially contributing.  Newborn currently in NICU for RSV.  Coping well emotional, told to look for signs of postpartum anxiety/depression.  2) Return in about 4 weeks (around 01/31/2022) for 6 week postpartum visit.   Malachy Mood, MD, [facility], [facility] 01/03/2022, 1:46 PM

## 2022-01-04 ENCOUNTER — Ambulatory Visit: Payer: BC Managed Care – PPO | Admitting: Obstetrics and Gynecology

## 2022-01-22 NOTE — Telephone Encounter (Signed)
06/28/21 Presents today for urinary symptoms (frequency). Urinalysis performed and forwarded to [facility] for advice. Fortunato Curling, CMA)

## 2022-01-31 ENCOUNTER — Ambulatory Visit (INDEPENDENT_AMBULATORY_CARE_PROVIDER_SITE_OTHER): Payer: BC Managed Care – PPO | Admitting: Obstetrics and Gynecology

## 2022-01-31 ENCOUNTER — Other Ambulatory Visit: Payer: Self-pay

## 2022-01-31 DIAGNOSIS — N39498 Other specified urinary incontinence: Secondary | ICD-10-CM

## 2022-01-31 NOTE — Progress Notes (Signed)
Postpartum Visit  Chief Complaint:  Chief Complaint  Patient presents with   Postpartum Care    History of Present Illness: Patient is a 36 y.o. G1P1001 presents for postpartum visit.  Date of delivery: 12/22/2021 Type of delivery: Vaginal Laceration: 2nd degree   Breast Feeding:  yes, but diminished supply Lochia: normal   Edinburgh Post-Partum Depression Score: 12  Date of last PAP: 05/18/2021 LSIL  She reports she and the baby had RSV and her baby was hospitalized, now at home doing well.   Newborn Details:  SINGLETON :  1. Infant Status: Infant doing well at home with mother.   Review of Systems: Review of Systems  Constitutional:  Negative for chills, fever, malaise/fatigue and weight loss.  HENT:  Negative for congestion, hearing loss and sinus pain.   Eyes:  Negative for blurred vision and double vision.  Respiratory:  Negative for cough, sputum production, shortness of breath and wheezing.   Cardiovascular:  Negative for chest pain, palpitations, orthopnea and leg swelling.  Gastrointestinal:  Negative for abdominal pain, constipation, diarrhea, nausea and vomiting.  Genitourinary:  Negative for dysuria, flank pain, frequency, hematuria and urgency.  Musculoskeletal:  Negative for back pain, falls and joint pain.  Skin:  Negative for itching and rash.  Neurological:  Negative for dizziness and headaches.  Psychiatric/Behavioral:  Negative for depression, substance abuse and suicidal ideas. The patient is not nervous/anxious.    Past Medical History:  Past Medical History:  Diagnosis Date   No pertinent past medical history     Past Surgical History:  Past Surgical History:  Procedure Laterality Date   NO PAST SURGERIES      Family History:  No family history on file.  Social History:  Social History   Socioeconomic History   Marital status: Significant Other    Spouse name: Erlene Quan   Number of children: Not on file   Years of education: Not on  file   Highest education level: Not on file  Occupational History   Occupation: [employer]ologist, educational  Tobacco Use   Smoking status: Former    Types: Cigarettes    Quit date: 11/22/2016    Years since quitting: 5.1   Smokeless tobacco: Never  Vaping Use   Vaping Use: Never used  Substance and Sexual Activity   Alcohol use: Yes    Comment: Rerely   Drug use: Never   Sexual activity: Yes    Partners: Male    Birth control/protection: None  Other Topics Concern   Not on file  Social History Narrative   Not on file   Social Determinants of Health   Financial Resource Strain: Not on file  Food Insecurity: Not on file  Transportation Needs: Not on file  Physical Activity: Not on file  Stress: Not on file  Social Connections: Not on file  Intimate Partner Violence: Not on file    Allergies:  No Known Allergies  Medications: Prior to Admission medications   Not on File    Physical Exam Vitals:  Vitals:   01/31/22 1315  BP: 118/70    Physical Exam Constitutional:      Appearance: She is well-developed.  Genitourinary:     Genitourinary Comments: External: Normal appearing vulva. No lesions noted.  Perineal laceration well healed.  HENT:     Head: Normocephalic and atraumatic.  Neck:     Thyroid: No thyromegaly.  Cardiovascular:     Rate and Rhythm: Normal rate and regular rhythm.  Heart sounds: Normal heart sounds.  Pulmonary:     Effort: Pulmonary effort is normal.     Breath sounds: Normal breath sounds.  Abdominal:     General: Bowel sounds are normal. There is no distension.     Palpations: Abdomen is soft. There is no mass.  Musculoskeletal:     Cervical back: Neck supple.  Neurological:     Mental Status: She is alert and oriented to person, place, and time.  Skin:    General: Skin is warm and dry.  Psychiatric:        Behavior: Behavior normal.        Thought Content: Thought content normal.        Judgment: Judgment normal.  Vitals  reviewed.    Assessment: 36 y.o. G1P1001 presenting for 6 week postpartum visit  Plan: Problem List Items Addressed This Visit   None Visit Diagnoses     Postpartum care and examination    -  Primary   Other urinary incontinence       Relevant Orders   Ambulatory referral to Physical Therapy       1) Contraception-  considering IUD  2)  Pap: Will need to be repeated May 2023  3) Patient underwent screening for postpartum depression with some concerns noted, but reports recently she has felt her mood improving.  4) Pelvic PT referral- given information on kegel exercises  5) Given information on increasing milk supply   - Follow up as needed   Adrian Prows MD, [place], [facility] 01/31/2022 1:44 PM

## 2022-01-31 NOTE — Patient Instructions (Signed)
INCREASING YOUR MILK SUPPLY ? ?Mothers make more milk by removing milk from their breasts often. ?Removing milk frequently: ? ?__ If you are breastfeeding your baby directly, ?breastfeed your baby at least every three hours during the day. ?__ Use your breast pump for 5 to 10 minutes after each feeding ?or pump approximately 30 to 60 minutes after you finish feeding ?(Choose the schedule that is more convenient for you and so you remove more milk with the pump.) ?__ If you are using a breast pump instead of breastfeeding directly, ?pump your breasts at least 8 times each 24 hours (every three hours). ? ?A schedule may be helpful as you plan at least 8 feedings and pumping. ?7:00 a.m. 10:00 a.m. 1:00 p.m. 4:00 p.m. ?7:00 p.m. 10:00 p.m. 1:00 a.m.* 4:00 a.m.* ?25% of your milk can be obtained during the night. ? ?*It is probably better to not set an alarm clock for nighttime feedings/pumping. If you or your ?baby wakes during the night, it is a good idea to feed or pump. You can feed or pump more ?often during the day (any time your baby is interested in feeding) in order to have 8 feedings ?or pumping each 24 hours. ? ?Removing as much milk as possible: ? ?__ Massage your breasts before feeding or pumping to move your milk to your nipple. Roll your ?nipples between your finger and thumb before feeding or pumping. ?__ Massage your breasts while you are feeding or pumping to help your milk flow. ?__ Always encourage your baby to take as much milk as possible at each feeding. Don?t stop ?your feedings just because a certain amount of time has passed. ? ?Taking care of yourself: ? ?__ Increase your rest for 3 to 5 days. Get other people to help with household chores. Sleep or ?rest when your baby sleeps. ?__ Eating nutritious foods helps you feel better. There are no foods that magically increase your ?milk supply. ?__ Drink plenty of fluids when you are thirsty. Don?t ?force? yourself to drink lots of water and ?fluids.  (Don?t drink more than you want.) ? ?Reglan (Metaclopromide): ? ?Reglan is a prescription medication which has been given to women to increase their breast milk ?supply. It increases the prolactin hormone which increases milk production. ?Reglan is often given to treat gastric reflux, a stomach condition. It is given to premature or fullterm babies who have this condition and is considered safe for these babies. Sometimes Reglan is ?given for months. ? ?Reglan is a medication which has few side effects. Some people find that it makes them sleepy. ?This can be helpful for new mothers who need to take naps since they are up at night with their ?babies. However, if you are very drowsy and sleepy when you drive a car, you need to have ?someone else drive, decrease your dosage of Reglan, or stop taking Reglan. ?If your milk supply does not increase with this dosage (after 1 to 2 weeks), ask your doctor if you ?can take 15mg of Reglan three times each day. ? ?After two weeks of taking Reglan, if your milk supply has increased enough to satisfy your baby, ?you can gradually reduce the amount that you take. If taking less Reglan causes you to make less ?milk, return to the dosage that you were taking. Some women need to maintain a low dose of ?Reglan for a long time. Other mothers find that they can stop taking raglan and keep making ?enough milk. ? ?Reglan helps about   80% of mothers make more milk. It is very important that you empty your ?breasts as completely as possible at least eight times each 24 hours. ? ?Fenugreek (Trigonella foenum-graecum): ? ?Fenugreek is an herb which is mostly used in the [place] to flavor artificial maple syrup. It ?has also been used to increase mothers? milk supply. ? ?Fenugreek is not recommended if you are pregnant, if you have asthma, or if you have ?diabetes. Fenugreek is sold in health food stores in tea and capsule forms. ? ?Mothers who take too much Fenugreek sometimes notice  that their skin and urine smell like ?maple syrup. Some mothers have noticed loose bowel movements when they take a large amount ?of Fenugreek. Many mothers take Fenugreek for several months. ? ?The usual dosage of Reglan that is given to mothers to increase their milk supply is: ?10-15mg by mouth three times a day for two weeks. ?The usual dosage of Fenugreek that is recommended for mothers to increase their milk supply is: ?3 capsules three times each day ?3-4 cups of tea per day ?It usually takes 3 to 5 days to notice much difference in your milk supply. ? ?

## 2022-02-14 ENCOUNTER — Telehealth: Payer: Self-pay

## 2022-02-14 NOTE — Telephone Encounter (Signed)
Patient is scheduled for 02/19/22 at 9:55 am for Mirena Placement

## 2022-02-14 NOTE — Telephone Encounter (Signed)
Pt states she is returning to work on 03/04/22. Adv note will be in MyChart.

## 2022-02-18 DIAGNOSIS — R87612 Low grade squamous intraepithelial lesion on cytologic smear of cervix (LGSIL): Secondary | ICD-10-CM | POA: Insufficient documentation
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
## 2022-02-19 ENCOUNTER — Other Ambulatory Visit (HOSPITAL_COMMUNITY)
Admission: RE | Admit: 2022-02-19 | Discharge: 2022-02-19 | Disposition: A | Discharge status: Home / Self Care | Payer: BC Managed Care – PPO | Source: Ambulatory Visit | Attending: Obstetrics and Gynecology | Admitting: Obstetrics and Gynecology

## 2022-02-19 ENCOUNTER — Ambulatory Visit: Payer: BC Managed Care – PPO | Admitting: Obstetrics and Gynecology

## 2022-02-19 ENCOUNTER — Encounter: Payer: Self-pay | Admitting: Obstetrics and Gynecology

## 2022-02-19 ENCOUNTER — Other Ambulatory Visit: Payer: Self-pay

## 2022-02-19 VITALS — BP 102/76 | Ht 67.0 in | Wt 199.0 lb

## 2022-02-19 DIAGNOSIS — R87612 Low grade squamous intraepithelial lesion on cytologic smear of cervix (LGSIL): Secondary | ICD-10-CM

## 2022-02-19 DIAGNOSIS — Z30430 Encounter for insertion of intrauterine contraceptive device: Secondary | ICD-10-CM

## 2022-02-19 DIAGNOSIS — Z1151 Encounter for screening for human papillomavirus (HPV): Secondary | ICD-10-CM | POA: Diagnosis not present

## 2022-02-19 DIAGNOSIS — Z124 Encounter for screening for malignant neoplasm of cervix: Secondary | ICD-10-CM

## 2022-02-19 MED ORDER — LEVONORGESTREL 20 MCG/DAY IU IUD: 1.0000 | INTRAUTERINE_SYSTEM | Freq: Once | INTRAUTERINE | 0 refills | Status: DC

## 2022-02-19 NOTE — Patient Instructions (Signed)
I value your feedback and you entrusting us with your care. If you get a [place] patient survey, I would appreciate you taking the time to let us know about your experience today. Thank you!  [facility] [phone]  Instructions after IUD insertion  Most women experience no significant problems after insertion of an IUD, however minor cramping and spotting for a few days is common. Cramps may be treated with ibuprofen 800mg every 8 hours or Tylenol 650 mg every 4 hours. Contact [facility] immediately if you experience any of the following symptoms during the next week: temperature >99.6 degrees, worsening pelvic pain, abdominal pain, fainting, unusually heavy vaginal bleeding, foul vaginal discharge, or if you think you have expelled the IUD.  Nothing inserted in the vagina for 48 hours. You will be scheduled for a follow up visit in approximately four weeks.  You should check monthly to be sure you can feel the IUD strings in the upper vagina. If you are having a monthly period, try to check after each period. If you cannot feel the IUD strings,  contact [facility] immediately so we can do an exam to determine if the IUD has been expelled.   Please use backup protection until we can confirm the IUD is in place.  Call [facility] if you are exposed to or diagnosed with a sexually transmitted infection, as we will need to discuss whether it is safe for you to continue using an IUD.   

## 2022-02-20 NOTE — Telephone Encounter (Signed)
Noted. Mirena rcvd/charged 02/19/22 ?

## 2022-03-04 NOTE — Progress Notes (Signed)
Pls call lab to add HPV DNA on. Thx.

## 2022-03-05 LAB — CYTOLOGY - PAP
Comment: NEGATIVE
High risk HPV: POSITIVE — AB

## 2022-03-19 ENCOUNTER — Ambulatory Visit: Payer: BC Managed Care – PPO | Admitting: Obstetrics and Gynecology

## 2022-03-19 ENCOUNTER — Other Ambulatory Visit: Payer: Self-pay

## 2022-03-19 ENCOUNTER — Encounter: Payer: Self-pay | Admitting: Obstetrics and Gynecology

## 2022-03-19 VITALS — BP 118/80 | Ht 67.0 in | Wt 207.0 lb

## 2022-03-19 DIAGNOSIS — F419 Anxiety disorder, unspecified: Secondary | ICD-10-CM

## 2022-03-19 DIAGNOSIS — Z30431 Encounter for routine checking of intrauterine contraceptive device: Secondary | ICD-10-CM | POA: Diagnosis not present

## 2022-03-19 NOTE — Progress Notes (Signed)
? ?  Chief Complaint  ?Patient presents with  ? IUD check  ?  Anxiety ramped up since insertion  ? ? ? ?History of Present Illness:  Erin Diaz is a 36 y.o. that had a Mirena IUD placed approximately 1 month ago for [facility].  Since that time, she has had almost daily light bleeding/spotting, occas dysmen, improved with NSAIDs. Hasn't been sexually active yet.  ?Has also noted increased anxiety sx since insertion. Had mood changes with OCPs in past. 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 Did paxil in past with depression improvement. Feels good now with depression but anxiety fluctuates. Pt is managing and prefers not to do medication at this time. Isn't seeing therapist currently. Sx mostly worry, fear something will happen to baby, has a hx of hypochondria. No SI.  ? ?Hx of abn pap, last one 2/23, has colpo sched 03/25/22 ? ?Past Medical History:  ?Diagnosis Date  ? Anxiety and depression   ? ?Past Surgical History:  ?Procedure Laterality Date  ? NO PAST SURGERIES    ? ?History reviewed. No pertinent family history. ? ?Review of Systems  ?Constitutional:  Negative for fever.  ?Gastrointestinal:  Negative for blood in stool, constipation, diarrhea, nausea and vomiting.  ?Genitourinary:  Positive for vaginal pain. Negative for dyspareunia, dysuria, flank pain, frequency, hematuria, urgency, vaginal bleeding and vaginal discharge.  ?Musculoskeletal:  Negative for back pain.  ?Skin:  Negative for rash.  ?Psychiatric/Behavioral:  Positive for agitation.   ? ?Physical Exam:  ?BP 118/80   Ht 5\' 7"  (1.702 m)   Wt 207 lb (93.9 kg)   Breastfeeding No   BMI 32.42 kg/m?  Body mass index is 32.42 kg/m?. ? ?Pelvic exam:  ?Two IUD strings present seen coming from the cervical os. ?EGBUS, vaginal vault and cervix: within normal limits ? ? ?  03/19/2022  ?  3:46 PM  ?GAD 7 : Generalized Anxiety Score  ?Nervous, Anxious, on Edge 3  ?Control/stop worrying 2  ?Worry too much - different things 2  ?Trouble relaxing 2   ?Restless 1  ?Easily annoyed or irritable 3  ?Afraid - awful might happen 3  ?Total GAD 7 Score 16  ?Anxiety Difficulty Somewhat difficult  ? ? ? ?  03/19/2022  ?  3:46 PM  ?Depression screen PHQ 2/9  ?Decreased Interest 1  ?Down, Depressed, Hopeless 0  ?PHQ - 2 Score 1  ?Altered sleeping 2  ?Tired, decreased energy 3  ?Change in appetite 1  ?Feeling bad or failure about yourself  0  ?Trouble concentrating 2  ?Moving slowly or fidgety/restless 1  ?Suicidal thoughts 0  ?PHQ-9 Score 10  ?Difficult doing work/chores Somewhat difficult  ? ? ?Assessment:  ? ?Encounter for routine checking of intrauterine contraceptive device (IUD)--IUD strings present in proper location; pt doing well. F/u if BTB/dysmen persists in a few months for GYN u/s.  ? ?Anxiety--discussed SSRIs vs buspar vs occas benzos. Pt to follow for now. Recommended therapist. F/u prn.  ? ? ?Return if symptoms worsen or fail to improve. ? ? ?Kaleyah Labreck B. Tom Ragsdale, PA-C ?03/19/2022 ?4:10 PM ?

## 2022-03-25 ENCOUNTER — Encounter: Payer: Self-pay | Admitting: Obstetrics and Gynecology

## 2022-03-25 ENCOUNTER — Other Ambulatory Visit (HOSPITAL_COMMUNITY)
Admission: RE | Admit: 2022-03-25 | Discharge: 2022-03-25 | Disposition: A | Discharge status: Home / Self Care | Payer: BC Managed Care – PPO | Source: Ambulatory Visit | Attending: Obstetrics and Gynecology | Admitting: Obstetrics and Gynecology

## 2022-03-25 ENCOUNTER — Ambulatory Visit (INDEPENDENT_AMBULATORY_CARE_PROVIDER_SITE_OTHER): Payer: BC Managed Care – PPO | Admitting: Obstetrics and Gynecology

## 2022-03-25 ENCOUNTER — Ambulatory Visit: Payer: BC Managed Care – PPO

## 2022-03-25 ENCOUNTER — Ambulatory Visit: Payer: BC Managed Care – PPO | Admitting: Obstetrics and Gynecology

## 2022-03-25 VITALS — BP 126/84 | Ht 67.0 in | Wt 211.0 lb

## 2022-03-25 DIAGNOSIS — R87612 Low grade squamous intraepithelial lesion on cytologic smear of cervix (LGSIL): Secondary | ICD-10-CM | POA: Diagnosis not present

## 2022-03-25 DIAGNOSIS — N870 Mild cervical dysplasia: Secondary | ICD-10-CM | POA: Diagnosis not present

## 2022-03-25 NOTE — Patient Instructions (Signed)
Colposcopy, Care After The following information offers guidance on how to care for yourself after your procedure. Your doctor may also give you more specific instructions. If you have problems or questions, contact your doctor. What can I expect after the procedure? If you did not have a sample of your tissue taken out (did not have a biopsy), you may only have some spotting of blood for a few days. You can go back to your normal activities. If you had a sample of your tissue taken out, it is common to have: Soreness and mild pain. These may last for a few days. Mild bleeding or fluid (discharge) coming from your vagina. The fluid will look dark and grainy. You may have this for a few days. The fluid may be caused by a liquid that was used during your procedure. You may need to wear a sanitary pad. Spotting of blood for at least 48 hours after the procedure. Follow these instructions at home: Medicines Take over-the-counter and prescription medicines only as told by your doctor. Ask your doctor what over-the-counter pain medicines and prescription medicines you can start taking again. This is very important if you take blood thinners. Activity For at least 3 days, or for as long as told by your doctor, avoid: Douching. Using tampons. Having sex. Return to your normal activities as told by your doctor. Ask your doctor what activities are safe for you. General instructions Ask your doctor if you may take baths, swim, or use a hot tub. You may take showers. If you use birth control (contraception), keep using it. Keep all follow-up visits. Contact a doctor if: You have a fever or chills. You faint or feel light-headed. Get help right away if: You bleed a lot from your vagina. A lot of bleeding means that the bleeding soaks through a pad in less than 1 hour. You have clumps of blood (blood clots) coming from your vagina. You have signs that could mean you have an infection. This may be fluid  coming from your vagina that is: Different than normal. Yellow. Bad-smelling. You have very bad pain or cramps in your lower belly that do not get better with medicine. Summary If you did not have a sample of your tissue taken out, you may only have some spotting of blood for a few days. You can go back to your normal activities. If you had a sample of your tissue taken out, it is common to have mild pain for a few days and spotting for 48 hours. Avoid douching, using tampons, and having sex for at least 3 days after the procedure or for as long as told. Get help right away if you have a lot of bleeding, very bad pain, or signs of infection. This information is not intended to replace advice given to you by your health care provider. Make sure you discuss any questions you have with your health care provider. Document Revised: 05/06/2021 Document Reviewed: 05/06/2021 Elsevier Patient Education  2022 Elsevier Inc.  

## 2022-03-25 NOTE — Progress Notes (Signed)
? ?  GYNECOLOGY CLINIC COLPOSCOPY PROCEDURE NOTE ? ?36 y.o. G1P1001 here for colposcopy for low-grade squamous intraepithelial neoplasia (LGSIL - encompassing HPV,mild dysplasia,CIN I)  pap smear on 02/19/2021. Discussed underlying role for HPV infection in the development of cervical dysplasia, its natural history and progression/regression, need for surveillance. ? ?Is the patient  pregnant: No ?LMP: No LMP recorded. (Menstrual status: IUD). ?Smoking status:  reports that she quit smoking about 5 years ago. Her smoking use included cigarettes. She has never used smokeless tobacco. ?Contraception: IUD ?Future fertility desired:  Yes ? ?Patient given informed consent, signed copy in the chart, time out was performed.  The patient was position in dorsal lithotomy position. Speculum was placed the cervix was visualized.   After application of acetic acid colposcopic inspection of the cervix was undertaken.  ? 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   ?ECC specimen obtained:  Yes  ?All specimens were labeled and sent to pathology. ?  ?Patient was given post procedure instructions.  Will follow up pathology and manage accordingly.  Routine preventative health maintenance measures emphasized. ? ?Physical Exam ?Genitourinary:  ? ? ? ? ? ?Adelene Idler MD ?[facility], [facility] ?03/25/2022 ?2:06 PM ? ?

## 2022-03-27 LAB — SURGICAL PATHOLOGY

## 2022-05-09 DIAGNOSIS — D485 Neoplasm of uncertain behavior of skin: Secondary | ICD-10-CM | POA: Diagnosis not present

## 2022-05-09 DIAGNOSIS — D2272 Melanocytic nevi of left lower limb, including hip: Secondary | ICD-10-CM | POA: Diagnosis not present

## 2022-05-09 DIAGNOSIS — D2261 Melanocytic nevi of right upper limb, including shoulder: Secondary | ICD-10-CM | POA: Diagnosis not present

## 2022-05-09 DIAGNOSIS — D2262 Melanocytic nevi of left upper limb, including shoulder: Secondary | ICD-10-CM | POA: Diagnosis not present

## 2022-05-09 DIAGNOSIS — D225 Melanocytic nevi of trunk: Secondary | ICD-10-CM | POA: Diagnosis not present

## 2022-06-20 ENCOUNTER — Ambulatory Visit: Payer: BC Managed Care – PPO | Attending: Obstetrics and Gynecology

## 2022-06-20 DIAGNOSIS — M6281 Muscle weakness (generalized): Secondary | ICD-10-CM | POA: Insufficient documentation

## 2022-06-20 DIAGNOSIS — M6289 Other specified disorders of muscle: Secondary | ICD-10-CM | POA: Insufficient documentation

## 2022-06-20 DIAGNOSIS — N39498 Other specified urinary incontinence: Secondary | ICD-10-CM | POA: Diagnosis not present

## 2022-06-20 DIAGNOSIS — R278 Other lack of coordination: Secondary | ICD-10-CM | POA: Diagnosis not present
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
## 2022-06-27 ENCOUNTER — Ambulatory Visit: Payer: BC Managed Care – PPO

## 2022-07-02 ENCOUNTER — Ambulatory Visit: Payer: BC Managed Care – PPO | Attending: Obstetrics and Gynecology

## 2022-07-02 DIAGNOSIS — M6289 Other specified disorders of muscle: Secondary | ICD-10-CM | POA: Diagnosis not present

## 2022-07-02 DIAGNOSIS — M6281 Muscle weakness (generalized): Secondary | ICD-10-CM | POA: Diagnosis not present

## 2022-07-02 DIAGNOSIS — R278 Other lack of coordination: Secondary | ICD-10-CM | POA: Insufficient documentation
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
## 2022-07-04 ENCOUNTER — Ambulatory Visit: Payer: BC Managed Care – PPO

## 2022-07-09 ENCOUNTER — Ambulatory Visit: Payer: BC Managed Care – PPO

## 2022-07-09 DIAGNOSIS — M6281 Muscle weakness (generalized): Secondary | ICD-10-CM

## 2022-07-09 DIAGNOSIS — R278 Other lack of coordination: Secondary | ICD-10-CM | POA: Diagnosis not present

## 2022-07-09 DIAGNOSIS — M6289 Other specified disorders of muscle: Secondary | ICD-10-CM
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
## 2022-07-11 ENCOUNTER — Ambulatory Visit: Payer: BC Managed Care – PPO

## 2022-07-16 ENCOUNTER — Ambulatory Visit: Payer: BC Managed Care – PPO

## 2022-07-16 DIAGNOSIS — M6281 Muscle weakness (generalized): Secondary | ICD-10-CM

## 2022-07-16 DIAGNOSIS — R278 Other lack of coordination: Secondary | ICD-10-CM | POA: Diagnosis not present

## 2022-07-16 DIAGNOSIS — M6289 Other specified disorders of muscle: Secondary | ICD-10-CM | POA: Diagnosis not present
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
## 2022-07-18 ENCOUNTER — Ambulatory Visit: Payer: BC Managed Care – PPO

## 2022-07-25 ENCOUNTER — Ambulatory Visit: Payer: BC Managed Care – PPO | Attending: Obstetrics and Gynecology

## 2022-07-25 DIAGNOSIS — M6289 Other specified disorders of muscle: Secondary | ICD-10-CM | POA: Diagnosis not present

## 2022-07-25 DIAGNOSIS — R278 Other lack of coordination: Secondary | ICD-10-CM | POA: Insufficient documentation

## 2022-07-25 DIAGNOSIS — M6281 Muscle weakness (generalized): Secondary | ICD-10-CM | POA: Diagnosis not present
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
## 2022-07-30 ENCOUNTER — Ambulatory Visit: Payer: BC Managed Care – PPO

## 2022-08-01 ENCOUNTER — Ambulatory Visit: Payer: BC Managed Care – PPO

## 2022-08-01 DIAGNOSIS — M6289 Other specified disorders of muscle: Secondary | ICD-10-CM | POA: Diagnosis not present

## 2022-08-01 DIAGNOSIS — R278 Other lack of coordination: Secondary | ICD-10-CM | POA: Diagnosis not present

## 2022-08-01 DIAGNOSIS — M6281 Muscle weakness (generalized): Secondary | ICD-10-CM
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
## 2022-08-07 ENCOUNTER — Ambulatory Visit: Payer: BC Managed Care – PPO

## 2022-08-12 DIAGNOSIS — R070 Pain in throat: Secondary | ICD-10-CM | POA: Diagnosis not present

## 2022-08-12 DIAGNOSIS — Z20822 Contact with and (suspected) exposure to covid-19: Secondary | ICD-10-CM | POA: Diagnosis not present

## 2022-08-13 ENCOUNTER — Other Ambulatory Visit: Payer: Self-pay

## 2022-08-13 ENCOUNTER — Emergency Department: Payer: BC Managed Care – PPO

## 2022-08-13 DIAGNOSIS — J029 Acute pharyngitis, unspecified: Secondary | ICD-10-CM | POA: Diagnosis not present

## 2022-08-13 DIAGNOSIS — R059 Cough, unspecified: Secondary | ICD-10-CM | POA: Diagnosis not present

## 2022-08-13 DIAGNOSIS — Z20822 Contact with and (suspected) exposure to covid-19: Secondary | ICD-10-CM | POA: Diagnosis not present

## 2022-08-13 DIAGNOSIS — R000 Tachycardia, unspecified: Secondary | ICD-10-CM | POA: Insufficient documentation

## 2022-08-13 DIAGNOSIS — R509 Fever, unspecified: Secondary | ICD-10-CM | POA: Diagnosis not present

## 2022-08-13 LAB — TROPONIN I (HIGH SENSITIVITY): Troponin I (High Sensitivity): 3 ng/L (ref <18)

## 2022-08-13 LAB — RESP PANEL BY RT-PCR (FLU A&B, COVID) ARPGX2
Influenza A by PCR: NEGATIVE
Influenza B by PCR: NEGATIVE
SARS Coronavirus 2 by RT PCR: NEGATIVE

## 2022-08-13 LAB — GROUP A STREP BY PCR: Group A Strep by PCR: NOT DETECTED

## 2022-08-13 MED ORDER — ACETAMINOPHEN 325 MG PO TABS
ORAL_TABLET | ORAL | Status: AC
  Filled 2022-08-13: qty 2

## 2022-08-13 MED ORDER — ACETAMINOPHEN 325 MG PO TABS
650.0000 mg | ORAL_TABLET | Freq: Once | ORAL | Status: AC | PRN
  Administered 2022-08-13: 650 mg via ORAL

## 2022-08-13 NOTE — ED Triage Notes (Signed)
Ambulatory to triage with c/o fever, congestion, and cough.  States son had hand foot and mouth last week. And she has had Tmax of 103 at home.  Saw urgent care this week, covid and strep swabs were negative. Temp in triage 101, pt states she took 800mg  Ibuprofen apx ago.

## 2022-08-14 ENCOUNTER — Other Ambulatory Visit: Payer: Self-pay

## 2022-08-14 ENCOUNTER — Emergency Department
Admission: EM | Admit: 2022-08-14 | Discharge: 2022-08-14 | Disposition: A | Discharge status: Home / Self Care | Payer: BC Managed Care – PPO | Attending: Emergency Medicine | Admitting: Emergency Medicine

## 2022-08-14 DIAGNOSIS — J029 Acute pharyngitis, unspecified: Secondary | ICD-10-CM

## 2022-08-14 DIAGNOSIS — R509 Fever, unspecified: Secondary | ICD-10-CM

## 2022-08-14 LAB — URINALYSIS, ROUTINE W REFLEX MICROSCOPIC
Bilirubin Urine: NEGATIVE
Glucose, UA: NEGATIVE mg/dL
Ketones, ur: 5 mg/dL — AB
Leukocytes,Ua: NEGATIVE
Nitrite: NEGATIVE
Protein, ur: NEGATIVE mg/dL
Specific Gravity, Urine: 1.002 — ABNORMAL LOW (ref 1.005–1.030)
pH: 6 (ref 5.0–8.0)

## 2022-08-14 LAB — POC URINE PREG, ED: Preg Test, Ur: NEGATIVE

## 2022-08-14 MED ORDER — ACETAMINOPHEN 325 MG PO TABS
ORAL_TABLET | ORAL | Status: AC
  Filled 2022-08-14: qty 2

## 2022-08-14 MED ORDER — ACETAMINOPHEN 325 MG PO TABS
650.0000 mg | ORAL_TABLET | Freq: Once | ORAL | Status: AC
  Administered 2022-08-14: 650 mg via ORAL

## 2022-08-14 MED ORDER — DEXAMETHASONE 10 MG/ML FOR PEDIATRIC ORAL USE
10.0000 mg | Freq: Once | INTRAMUSCULAR | Status: AC
  Administered 2022-08-14: 10 mg via ORAL
  Filled 2022-08-14: qty 1

## 2022-08-14 NOTE — Discharge Instructions (Signed)
Your COVID influenza and strep test were negative.  I suspect that you have a viral illness causing the fever and throat pain.  Continue to take Tylenol and Motrin for fever and pain.  Please make sure you are staying hydrated.  If in the next 2 to 3 days you are still having fevers or you are developing new symptoms that are concerning to you please return to the emergency department or follow-up with your primary care provider.
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
## 2022-08-15 ENCOUNTER — Ambulatory Visit: Payer: BC Managed Care – PPO

## 2022-08-15 DIAGNOSIS — R070 Pain in throat: Secondary | ICD-10-CM | POA: Diagnosis not present

## 2022-08-15 DIAGNOSIS — J039 Acute tonsillitis, unspecified: Secondary | ICD-10-CM | POA: Diagnosis not present

## 2022-08-21 ENCOUNTER — Ambulatory Visit: Payer: BC Managed Care – PPO

## 2022-08-29 ENCOUNTER — Ambulatory Visit: Payer: BC Managed Care – PPO | Attending: Obstetrics and Gynecology

## 2022-08-29 DIAGNOSIS — M6281 Muscle weakness (generalized): Secondary | ICD-10-CM

## 2022-08-29 DIAGNOSIS — M6289 Other specified disorders of muscle: Secondary | ICD-10-CM | POA: Diagnosis not present

## 2022-08-29 DIAGNOSIS — R278 Other lack of coordination: Secondary | ICD-10-CM
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
## 2022-09-12 ENCOUNTER — Ambulatory Visit: Payer: BC Managed Care – PPO

## 2022-09-12 DIAGNOSIS — R278 Other lack of coordination: Secondary | ICD-10-CM

## 2022-09-12 DIAGNOSIS — M6281 Muscle weakness (generalized): Secondary | ICD-10-CM | POA: Diagnosis not present

## 2022-09-12 DIAGNOSIS — M6289 Other specified disorders of muscle: Secondary | ICD-10-CM | POA: Diagnosis not present
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
## 2022-10-07 ENCOUNTER — Ambulatory Visit: Payer: BC Managed Care – PPO | Attending: Obstetrics and Gynecology

## 2022-10-08 ENCOUNTER — Telehealth: Payer: Self-pay

## 2022-10-08 NOTE — Telephone Encounter (Signed)
LVM regarding no-show on 10/07/22. DPT asked Pt to call back if interested in discharge or one more follow-up appt as Pt has been progressing in PFPT.

## 2022-12-05 ENCOUNTER — Ambulatory Visit
Admission: EM | Admit: 2022-12-05 | Discharge: 2022-12-05 | Disposition: A | Discharge status: Home / Self Care | Payer: BC Managed Care – PPO | Attending: Physician Assistant | Admitting: Physician Assistant

## 2022-12-05 DIAGNOSIS — R251 Tremor, unspecified: Secondary | ICD-10-CM | POA: Insufficient documentation

## 2022-12-05 DIAGNOSIS — M79602 Pain in left arm: Secondary | ICD-10-CM | POA: Diagnosis not present

## 2022-12-05 DIAGNOSIS — L659 Nonscarring hair loss, unspecified: Secondary | ICD-10-CM | POA: Diagnosis not present

## 2022-12-05 DIAGNOSIS — R202 Paresthesia of skin: Secondary | ICD-10-CM | POA: Diagnosis not present

## 2022-12-05 DIAGNOSIS — M79601 Pain in right arm: Secondary | ICD-10-CM | POA: Insufficient documentation

## 2022-12-05 DIAGNOSIS — R5383 Other fatigue: Secondary | ICD-10-CM | POA: Insufficient documentation

## 2022-12-05 DIAGNOSIS — R519 Headache, unspecified: Secondary | ICD-10-CM | POA: Diagnosis not present

## 2022-12-05 LAB — COMPREHENSIVE METABOLIC PANEL
ALT: 14 U/L (ref 0–44)
AST: 18 U/L (ref 15–41)
Albumin: 4.4 g/dL (ref 3.5–5.0)
Alkaline Phosphatase: 107 U/L (ref 38–126)
Anion gap: 9 (ref 5–15)
BUN: 7 mg/dL (ref 6–20)
CO2: 26 mmol/L (ref 22–32)
Calcium: 9.2 mg/dL (ref 8.9–10.3)
Chloride: 103 mmol/L (ref 98–111)
Creatinine, Ser: 0.65 mg/dL (ref 0.44–1.00)
GFR, Estimated: >60 mL/min (ref >60)
Glucose, Bld: 83 mg/dL (ref 70–99)
Potassium: 4 mmol/L (ref 3.5–5.1)
Sodium: 138 mmol/L (ref 135–145)
Total Bilirubin: 0.7 mg/dL (ref 0.3–1.2)
Total Protein: 8 g/dL (ref 6.5–8.1)

## 2022-12-05 LAB — CBC WITH DIFFERENTIAL/PLATELET
Abs Immature Granulocytes: 0.03 10*3/uL (ref 0.00–0.07)
Basophils Absolute: 0.1 10*3/uL (ref 0.0–0.1)
Basophils Relative: 1 %
Eosinophils Absolute: 0.1 10*3/uL (ref 0.0–0.5)
Eosinophils Relative: 1 %
HCT: 41.4 % (ref 36.0–46.0)
Hemoglobin: 13.6 g/dL (ref 12.0–15.0)
Immature Granulocytes: 0 %
Lymphocytes Relative: 29 %
Lymphs Abs: 3.4 10*3/uL (ref 0.7–4.0)
MCH: 27.1 pg (ref 26.0–34.0)
MCHC: 32.9 g/dL (ref 30.0–36.0)
MCV: 82.5 fL (ref 80.0–100.0)
Monocytes Absolute: 0.7 10*3/uL (ref 0.1–1.0)
Monocytes Relative: 6 %
Neutro Abs: 7.3 10*3/uL (ref 1.7–7.7)
Neutrophils Relative %: 63 %
Platelets: 366 10*3/uL (ref 150–400)
RBC: 5.02 MIL/uL (ref 3.87–5.11)
RDW: 14.7 % (ref 11.5–15.5)
WBC: 11.5 10*3/uL — ABNORMAL HIGH (ref 4.0–10.5)
nRBC: 0 % (ref 0.0–0.2)

## 2022-12-05 LAB — C-REACTIVE PROTEIN: CRP: 2.1 mg/dL — ABNORMAL HIGH (ref <1.0)

## 2022-12-05 LAB — FOLATE: Folate: 11.9 ng/mL (ref >5.9)

## 2022-12-05 LAB — VITAMIN B12: Vitamin B-12: 321 pg/mL (ref 180–914)

## 2022-12-05 LAB — SEDIMENTATION RATE: Sed Rate: 11 mm/hr (ref 0–20)
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
## 2022-12-05 NOTE — Discharge Instructions (Signed)
-  Doing some lab work to see if we can find any cause for your symptoms. - We have also set you up with a PCP.  If I cannot determine the cause of your symptoms, you will need to follow-up with your PCP regarding this.  Other testing may be performed but we have reached the extent of our testing in urgent care. - Try ibuprofen and Tylenol for discomfort and hydrate well.  Start on a multivitamin. - I will contact you with the results of your lab work.

## 2022-12-05 NOTE — ED Triage Notes (Signed)
Pt c/o pins & needles type feeling occurring on & off in all 4 limbs which has been increasing & tremor like feeling ongoing x2 days. Has also been having hair loss & is 11 mon postpartum. Pt concerned about having anemia or vitamin deficiency. Has hx of anxiety & depression.

## 2022-12-07 LAB — THYROID PANEL WITH TSH
Free Thyroxine Index: 2.3 (ref 1.2–4.9)
T3 Uptake Ratio: 25 % (ref 24–39)
T4, Total: 9.3 ug/dL (ref 4.5–12.0)
TSH: 1.1 u[IU]/mL (ref 0.450–4.500)

## 2022-12-07 LAB — ANTINUCLEAR ANTIBODIES, IFA: ANA Ab, IFA: NEGATIVE

## 2023-01-27 ENCOUNTER — Encounter: Payer: Self-pay | Admitting: Nurse Practitioner

## 2023-01-27 ENCOUNTER — Ambulatory Visit: Payer: BC Managed Care – PPO | Admitting: Nurse Practitioner

## 2023-01-27 VITALS — BP 100/64 | HR 104 | Temp 98.3°F | Resp 16 | Ht 67.0 in | Wt 206.9 lb

## 2023-01-27 DIAGNOSIS — K5900 Constipation, unspecified: Secondary | ICD-10-CM

## 2023-01-27 DIAGNOSIS — R202 Paresthesia of skin: Secondary | ICD-10-CM

## 2023-01-27 DIAGNOSIS — L659 Nonscarring hair loss, unspecified: Secondary | ICD-10-CM

## 2023-01-27 DIAGNOSIS — F419 Anxiety disorder, unspecified: Secondary | ICD-10-CM | POA: Diagnosis not present

## 2023-01-27 DIAGNOSIS — E559 Vitamin D deficiency, unspecified: Secondary | ICD-10-CM | POA: Diagnosis not present

## 2023-01-27 DIAGNOSIS — Z7689 Persons encountering health services in other specified circumstances: Secondary | ICD-10-CM | POA: Diagnosis not present

## 2023-01-27 MED ORDER — ESCITALOPRAM OXALATE 10 MG PO TABS: 10.0000 mg | ORAL_TABLET | Freq: Every day | ORAL | 0 refills | Status: DC

## 2023-01-27 NOTE — Patient Instructions (Signed)
Tingling limbs: start vitamin b 12 , getting labs vitamin d and repeat ana, referral to neurology  Hair loss: checking vitamin d  Constipation/nausea:  start lexapro for anxiety,  increase fiber, drink water  Anxiety start lexapro follow up in four weeks

## 2023-01-27 NOTE — Progress Notes (Signed)
BP 100/64   Pulse (!) 104   Temp 98.3 F (36.8 C) (Oral)   Resp 16   Ht 5\' 7"  (1.702 m)   Wt 206 lb 14.4 oz (93.8 kg)   SpO2 99%   Breastfeeding No   BMI 32.41 kg/m    Subjective:    Patient ID: Erin Diaz, female    DOB: March 19, 1986, 37 y.o.   MRN: [id number]  HPI: Erin Diaz is a 37 y.o. female  Chief Complaint  Patient presents with   Establish Care   Alopecia    Post partum around 6 months   Tingling    Multiple limbs   Establish care: her last physical was years ago.  Medical history includes none.  Family history includes none.  Health maintenance up to date.  She has an IUD in.    Tingling limbs: she was seen in the er on 12/05/2022.  It has been going on for months. They did several labs including ANA, sed rate, CRP, folate, vitamin b 12, thyroid panel, cmp. Everything was normal except the crp was slightly elevated. She says that she has had these intermittent episodes have been going on for years.  She says that before she went to the er it lingered in her right leg.  She says that it last for about two days and then tapered off.  She says that she was not treated with anything. She says she has not had another episode like that since her er visit. Dicussed er labs with patient and recommend starting vitamin b12 supplement.  Will check vitamin d and another ana.  Will also send to neurology for further work up.    Hair loss: she says that about six months after delivery she was losing her hair. She says that she notices that her hair was coming out in her fingers after washing her hair or combing her fingers through her hair.  She is not currently taking any medications.    Constipation/nausea: patient reports she has always struggled with constipation.  She says that she would take stool softener and then would have diarrhea.  She says she would take metamucil.  She says she has been doing good since the last few weeks.  She says that she is not having upset stomach  with some foods.  She says that sometimes it was more tomato based things, she says it was not always consistent. She says she was not able to pinpoint what exactly would cause it.    Anxiety: Her pHQ9 score is negative, her GAD score is positive.   She says that she has had anxiety in the past.  She says that since having her baby she has noticed that it has gotten worse.   She says it comes in waves.  She is not breast feeding.  She has never been on medication for it before.      01/27/2023    2:16 PM 03/19/2022    3:46 PM  Depression screen PHQ 2/9  Decreased Interest 0 1  Down, Depressed, Hopeless 0 0  PHQ - 2 Score 0 1  Altered sleeping 0 2  Tired, decreased energy 0 3  Change in appetite 0 1  Feeling bad or failure about yourself  0 0  Trouble concentrating 0 2  Moving slowly or fidgety/restless 0 1  Suicidal thoughts 0 0  PHQ-9 Score 0 10  Difficult doing work/chores Not difficult at all Somewhat difficult  03/19/2022    3:46 PM  GAD 7 : Generalized Anxiety Score  Nervous, Anxious, on Edge 3  Control/stop worrying 2  Worry too much - different things 2  Trouble relaxing 2  Restless 1  Easily annoyed or irritable 3  Afraid - awful might happen 3  Total GAD 7 Score 16  Anxiety Difficulty Somewhat difficult     Relevant past medical, surgical, family and social history reviewed and updated as indicated. Interim medical history since our last visit reviewed. Allergies and medications reviewed and updated.  Review of Systems  Constitutional: Negative for fever or weight change.  Respiratory: Negative for cough and shortness of breath.   Cardiovascular: Negative for chest pain or palpitations.  Gastrointestinal: Negative for abdominal pain, no bowel changes.  Musculoskeletal: Negative for gait problem or joint swelling.  Skin: Negative for rash.  Neurological: Negative for dizziness or headache.  No other specific complaints in a complete review of systems (except  as listed in HPI above).      Objective:    BP 100/64   Pulse (!) 104   Temp 98.3 F (36.8 C) (Oral)   Resp 16   Ht 5\' 7"  (1.702 m)   Wt 206 lb 14.4 oz (93.8 kg)   SpO2 99%   Breastfeeding No   BMI 32.41 kg/m   Wt Readings from Last 3 Encounters:  01/27/23 206 lb 14.4 oz (93.8 kg)  12/05/22 220 lb (99.8 kg)  03/25/22 211 lb (95.7 kg)    Physical Exam  Constitutional: Patient appears well-developed and well-nourished. Obese  No distress.  HEENT: head atraumatic, normocephalic, pupils equal and reactive to light, neck supple Cardiovascular: Normal rate, regular rhythm and normal heart sounds.  No murmur heard. No BLE edema. Pulmonary/Chest: Effort normal and breath sounds normal. No respiratory distress. Abdominal: Soft.  There is no tenderness. Psychiatric: Patient has a normal mood and affect. behavior is normal. Judgment and thought content normal.  Results for orders placed or performed during the hospital encounter of 12/05/22  Comprehensive metabolic panel  Result Value Ref Range   Sodium 138 135 - 145 mmol/L   Potassium 4.0 3.5 - 5.1 mmol/L   Chloride 103 98 - 111 mmol/L   CO2 26 22 - 32 mmol/L   Glucose, Bld 83 70 - 99 mg/dL   BUN 7 6 - 20 mg/dL   Creatinine, Ser 0.65 0.44 - 1.00 mg/dL   Calcium 9.2 8.9 - 10.3 mg/dL   Total Protein 8.0 6.5 - 8.1 g/dL   Albumin 4.4 3.5 - 5.0 g/dL   AST 18 15 - 41 U/L   ALT 14 0 - 44 U/L   Alkaline Phosphatase 107 38 - 126 U/L   Total Bilirubin 0.7 0.3 - 1.2 mg/dL   GFR, Estimated >60 >60 mL/min   Anion gap 9 5 - 15  CBC with Differential  Result Value Ref Range   WBC 11.5 (H) 4.0 - 10.5 K/uL   RBC 5.02 3.87 - 5.11 MIL/uL   Hemoglobin 13.6 12.0 - 15.0 g/dL   HCT 41.4 36.0 - 46.0 %   MCV 82.5 80.0 - 100.0 fL   MCH 27.1 26.0 - 34.0 pg   MCHC 32.9 30.0 - 36.0 g/dL   RDW 14.7 11.5 - 15.5 %   Platelets 366 150 - 400 K/uL   nRBC 0.0 0.0 - 0.2 %   Neutrophils Relative % 63 %   Neutro Abs 7.3 1.7 - 7.7 K/uL   Lymphocytes  Relative 29 %  Lymphs Abs 3.4 0.7 - 4.0 K/uL   Monocytes Relative 6 %   Monocytes Absolute 0.7 0.1 - 1.0 K/uL   Eosinophils Relative 1 %   Eosinophils Absolute 0.1 0.0 - 0.5 K/uL   Basophils Relative 1 %   Basophils Absolute 0.1 0.0 - 0.1 K/uL   Immature Granulocytes 0 %   Abs Immature Granulocytes 0.03 0.00 - 0.07 K/uL  Sedimentation rate  Result Value Ref Range   Sed Rate 11 0 - 20 mm/hr  C-reactive protein  Result Value Ref Range   CRP 2.1 (H) <1.0 mg/dL  Thyroid Panel With TSH  Result Value Ref Range   TSH 1.100 0.450 - 4.500 uIU/mL   T4, Total 9.3 4.5 - 12.0 ug/dL   T3 Uptake Ratio 25 24 - 39 %   Free Thyroxine Index 2.3 1.2 - 4.9  Antinuclear Antibodies, IFA  Result Value Ref Range   ANA Ab, IFA Negative   Vitamin B12  Result Value Ref Range   Vitamin B-12 321 180 - 914 pg/mL  Folate, serum, performed at [facility] lab  Result Value Ref Range   Folate 11.9 >5.9 ng/mL      Assessment & Plan:   Problem List Items Addressed This Visit       Other   Anxiety    Start lexapro 10 mg daily. Follow up in 4 weeks      Relevant Medications   escitalopram (LEXAPRO) 10 MG tablet   Other Relevant Orders   Vitamin D (25 hydroxy)   ANA   Other Visit Diagnoses     Pins and needles sensation    -  Primary   start b12 supplement, getting a repeat ana and vitamin d level. referral placed to neurology   Relevant Orders   Vitamin D (25 hydroxy)   ANA   Ambulatory referral to Neurology   Encounter to establish care       schedule cpe when due   Hair loss       checking vitamin d level   Relevant Orders   Vitamin D (25 hydroxy)   ANA   Constipation, unspecified constipation type       increase fiber, push fluids        Follow up plan: Return in about 4 weeks (around 02/24/2023) for follow up.

## 2023-01-27 NOTE — Assessment & Plan Note (Signed)
Start lexapro 10 mg daily. Follow up in 4 weeks

## 2023-01-28 LAB — ANA: Anti Nuclear Antibody (ANA): NEGATIVE

## 2023-01-28 LAB — VITAMIN D 25 HYDROXY (VIT D DEFICIENCY, FRACTURES): Vit D, 25-Hydroxy: 20 ng/mL — ABNORMAL LOW (ref 30–100)

## 2023-01-29 ENCOUNTER — Other Ambulatory Visit: Payer: Self-pay | Admitting: Nurse Practitioner

## 2023-01-29 DIAGNOSIS — E559 Vitamin D deficiency, unspecified: Secondary | ICD-10-CM

## 2023-01-29 MED ORDER — VITAMIN D3 50 MCG (2000 UT) PO CAPS: 2000.0000 [IU] | ORAL_CAPSULE | Freq: Every day | ORAL | 3 refills | Status: AC

## 2023-02-06 DIAGNOSIS — F411 Generalized anxiety disorder: Secondary | ICD-10-CM | POA: Diagnosis not present

## 2023-02-06 DIAGNOSIS — M79604 Pain in right leg: Secondary | ICD-10-CM | POA: Diagnosis not present

## 2023-02-06 DIAGNOSIS — R200 Anesthesia of skin: Secondary | ICD-10-CM | POA: Diagnosis not present

## 2023-02-06 DIAGNOSIS — M79601 Pain in right arm: Secondary | ICD-10-CM | POA: Diagnosis not present

## 2023-02-13 ENCOUNTER — Other Ambulatory Visit: Payer: Self-pay | Admitting: Student

## 2023-02-13 DIAGNOSIS — R200 Anesthesia of skin: Secondary | ICD-10-CM

## 2023-02-13 DIAGNOSIS — M79604 Pain in right leg: Secondary | ICD-10-CM

## 2023-02-13 DIAGNOSIS — M79601 Pain in right arm: Secondary | ICD-10-CM

## 2023-02-18 ENCOUNTER — Other Ambulatory Visit: Payer: Self-pay | Admitting: Nurse Practitioner

## 2023-02-18 DIAGNOSIS — F419 Anxiety disorder, unspecified: Secondary | ICD-10-CM

## 2023-02-19 NOTE — Telephone Encounter (Signed)
Requested medication (s) are due for refill today: yes  Requested medication (s) are on the active medication list: yes  Last refill:  01/27/23  Future visit scheduled: yes  Notes to clinic:    Pharmacy comment: [place]. DX Code Needed.            Requested Prescriptions  Pending Prescriptions Disp Refills   escitalopram (LEXAPRO) 10 MG tablet [Pharmacy Med Name: ESCITALOPRAM 10 MG TABLET] 90 tablet 1    Sig: TAKE 1 TABLET BY MOUTH EVERY DAY     Psychiatry:  Antidepressants - SSRI Passed - 02/18/2023 12:30 PM      Passed - Completed PHQ-2 or PHQ-9 in the last 360 days      Passed - Valid encounter within last 6 months    Recent Outpatient Visits           3 weeks ago Pins and needles sensation   [facility] Bo Merino, FNP       Future Appointments             In 1 week Reece Packer, Myna Hidalgo, [facility], [facility]

## 2023-02-28 ENCOUNTER — Other Ambulatory Visit: Payer: Self-pay

## 2023-02-28 ENCOUNTER — Ambulatory Visit: Payer: BC Managed Care – PPO | Admitting: Nurse Practitioner

## 2023-02-28 ENCOUNTER — Encounter: Payer: Self-pay | Admitting: Nurse Practitioner

## 2023-02-28 VITALS — BP 122/70 | HR 100 | Temp 98.0°F | Resp 16 | Ht 67.0 in | Wt 205.5 lb

## 2023-02-28 DIAGNOSIS — K5900 Constipation, unspecified: Secondary | ICD-10-CM | POA: Diagnosis not present

## 2023-02-28 DIAGNOSIS — R202 Paresthesia of skin: Secondary | ICD-10-CM | POA: Diagnosis not present

## 2023-02-28 DIAGNOSIS — F419 Anxiety disorder, unspecified: Secondary | ICD-10-CM | POA: Diagnosis not present

## 2023-02-28 DIAGNOSIS — Z8719 Personal history of other diseases of the digestive system: Secondary | ICD-10-CM

## 2023-02-28 NOTE — Progress Notes (Signed)
BP 122/70   Pulse 100   Temp 98 F (36.7 C) (Oral)   Resp 16   Ht '5\' 7"'$  (1.702 m)   Wt 205 lb 8 oz (93.2 kg)   SpO2 98%   BMI 32.19 kg/m    Subjective:    Patient ID: Erin Diaz, female    DOB: 1986-10-31, 37 y.o.   MRN: [id number]  HPI: Erin Diaz is a 37 y.o. female  Chief Complaint  Patient presents with   Anxiety    4 week recheck, change in meds   Tingling limbs: she was seen in the er on 12/05/2022.  It has been going on for months. They did several labs including ANA, sed rate, CRP, folate, vitamin b 12, thyroid panel, cmp. Everything was normal except the crp was slightly elevated. She says that she has had these intermittent episodes have been going on for years.  She says that before she went to the er it lingered in her right leg.  She says that it last for about two days and then tapered off.  She says that she was not treated with anything. Last visit referred patient to neurology.  She saw neurology on 02/06/2023. They started her on propanolol 20 mg twice daily.  She is also going to get EMG, MRI brain and c-spine.    Anxiety: Her pHQ9 score is negative, her GAD score is positive.   She says that she has had anxiety in the past.  She says that since having her baby she has noticed that it has gotten worse.   She says it comes in waves.  She is not breast feeding.  Last visit started her on lexapro she says it caused her anxiety to get worse, she took it for one week. She was not able to tolerate it.  Neurology started her on propanolol 20 mg twice daily. Patient reports she is doing better on the propanolol. Will continue with current treatment.        02/28/2023    2:05 PM 01/27/2023    2:16 PM 03/19/2022    3:46 PM  Depression screen PHQ 2/9  Decreased Interest 0 0 1  Down, Depressed, Hopeless 0 0 0  PHQ - 2 Score 0 0 1  Altered sleeping 1 0 2  Tired, decreased energy 1 0 3  Change in appetite 0 0 1  Feeling bad or failure about yourself  0 0 0  Trouble  concentrating 1 0 2  Moving slowly or fidgety/restless 1 0 1  Suicidal thoughts 0 0 0  PHQ-9 Score 4 0 10  Difficult doing work/chores Somewhat difficult Not difficult at all Somewhat difficult       02/28/2023    2:06 PM 03/19/2022    3:46 PM  GAD 7 : Generalized Anxiety Score  Nervous, Anxious, on Edge 1 3  Control/stop worrying 1 2  Worry too much - different things 1 2  Trouble relaxing 1 2  Restless 1 1  Easily annoyed or irritable 1 3  Afraid - awful might happen 1 3  Total GAD 7 Score 7 16  Anxiety Difficulty Somewhat difficult Somewhat difficult   Constipation/blood in stool/anal fissures:  she says she has been having anal fissures, constipation and blood in her stool for about five months. Discussed increase fiber and water intake.  Discussed seeing gi and will place referral.   Relevant past medical, surgical, family and social history reviewed and updated as indicated. Interim medical history since  our last visit reviewed. Allergies and medications reviewed and updated.  Review of Systems  Constitutional: Negative for fever or weight change.  Respiratory: Negative for cough and shortness of breath.   Cardiovascular: Negative for chest pain or palpitations.  Gastrointestinal: Negative for abdominal pain, no bowel changes.  Musculoskeletal: Negative for gait problem or joint swelling.  Skin: Negative for rash.  Neurological: Negative for dizziness or headache.  No other specific complaints in a complete review of systems (except as listed in HPI above).      Objective:    BP 122/70   Pulse 100   Temp 98 F (36.7 C) (Oral)   Resp 16   Ht '5\' 7"'$  (1.702 m)   Wt 205 lb 8 oz (93.2 kg)   SpO2 98%   BMI 32.19 kg/m   Wt Readings from Last 3 Encounters:  02/28/23 205 lb 8 oz (93.2 kg)  01/27/23 206 lb 14.4 oz (93.8 kg)  12/05/22 220 lb (99.8 kg)    Physical Exam  Constitutional: Patient appears well-developed and well-nourished. Obese  No distress.  HEENT: head  atraumatic, normocephalic, pupils equal and reactive to light, neck supple Cardiovascular: Normal rate, regular rhythm and normal heart sounds.  No murmur heard. No BLE edema. Pulmonary/Chest: Effort normal and breath sounds normal. No respiratory distress. Abdominal: Soft.  There is no tenderness. Psychiatric: Patient has a normal mood and affect. behavior is normal. Judgment and thought content normal.  Results for orders placed or performed in visit on 01/27/23  Vitamin D (25 hydroxy)  Result Value Ref Range   Vit D, 25-Hydroxy 20 (L) 30 - 100 ng/mL  ANA  Result Value Ref Range   Anti Nuclear Antibody (ANA) NEGATIVE NEGATIVE      Assessment & Plan:   Problem List Items Addressed This Visit       Other   Anxiety    Continue propanolol 20 mg bid      Other Visit Diagnoses     Constipation, unspecified constipation type    -  Primary   referral placed to gi   Relevant Orders   Ambulatory referral to Gastroenterology   History of rectal fissure       referral to gi   Pins and needles sensation       scheduled to get mri and nerve testing done,  is established with neurology        Follow up plan: Return in about 6 months (around 08/31/2023) for follow up.

## 2023-02-28 NOTE — Assessment & Plan Note (Signed)
Continue propanolol 20 mg bid

## 2023-03-05 ENCOUNTER — Other Ambulatory Visit: Payer: BC Managed Care – PPO

## 2023-03-12 DIAGNOSIS — R200 Anesthesia of skin: Secondary | ICD-10-CM | POA: Diagnosis not present

## 2023-03-12 DIAGNOSIS — R202 Paresthesia of skin: Secondary | ICD-10-CM | POA: Diagnosis not present

## 2023-03-25 ENCOUNTER — Ambulatory Visit
Admission: RE | Admit: 2023-03-25 | Discharge: 2023-03-25 | Disposition: A | Discharge status: Home / Self Care | Payer: BC Managed Care – PPO | Source: Ambulatory Visit | Attending: Student | Admitting: Student

## 2023-03-25 DIAGNOSIS — R200 Anesthesia of skin: Secondary | ICD-10-CM | POA: Diagnosis not present

## 2023-03-25 DIAGNOSIS — M79601 Pain in right arm: Secondary | ICD-10-CM

## 2023-03-25 DIAGNOSIS — R202 Paresthesia of skin: Secondary | ICD-10-CM | POA: Diagnosis not present

## 2023-03-25 DIAGNOSIS — M79604 Pain in right leg: Secondary | ICD-10-CM

## 2023-04-02 DIAGNOSIS — M25551 Pain in right hip: Secondary | ICD-10-CM | POA: Diagnosis not present

## 2023-04-02 DIAGNOSIS — M545 Low back pain, unspecified: Secondary | ICD-10-CM | POA: Diagnosis not present

## 2023-04-07 ENCOUNTER — Other Ambulatory Visit: Payer: Self-pay | Admitting: Student

## 2023-04-07 DIAGNOSIS — R202 Paresthesia of skin: Secondary | ICD-10-CM | POA: Diagnosis not present

## 2023-04-07 DIAGNOSIS — F411 Generalized anxiety disorder: Secondary | ICD-10-CM | POA: Diagnosis not present

## 2023-04-07 DIAGNOSIS — R200 Anesthesia of skin: Secondary | ICD-10-CM | POA: Diagnosis not present

## 2023-04-07 DIAGNOSIS — M79601 Pain in right arm: Secondary | ICD-10-CM | POA: Diagnosis not present

## 2023-04-11 ENCOUNTER — Encounter: Payer: Self-pay | Admitting: Student

## 2023-04-14 ENCOUNTER — Ambulatory Visit
Admission: RE | Admit: 2023-04-14 | Discharge: 2023-04-14 | Disposition: A | Discharge status: Home / Self Care | Payer: BC Managed Care – PPO | Source: Ambulatory Visit | Attending: Student | Admitting: Student

## 2023-04-14 DIAGNOSIS — R200 Anesthesia of skin: Secondary | ICD-10-CM

## 2023-04-14 DIAGNOSIS — J321 Chronic frontal sinusitis: Secondary | ICD-10-CM | POA: Diagnosis not present

## 2023-04-15 ENCOUNTER — Telehealth: Payer: BC Managed Care – PPO | Admitting: Physician Assistant

## 2023-04-15 DIAGNOSIS — J019 Acute sinusitis, unspecified: Secondary | ICD-10-CM

## 2023-04-15 DIAGNOSIS — B9689 Other specified bacterial agents as the cause of diseases classified elsewhere: Secondary | ICD-10-CM | POA: Diagnosis not present

## 2023-04-15 MED ORDER — AMOXICILLIN-POT CLAVULANATE 875-125 MG PO TABS: 1.0000 | ORAL_TABLET | Freq: Two times a day (BID) | ORAL | 0 refills | Status: DC

## 2023-04-15 NOTE — Progress Notes (Signed)
I have spent 5 minutes in review of e-visit questionnaire, review and updating patient chart, medical decision making and response to patient.   Reverie Vaquera Cody Beckam Abdulaziz, PA-C    

## 2023-04-15 NOTE — Progress Notes (Signed)

## 2023-04-29 DIAGNOSIS — J302 Other seasonal allergic rhinitis: Secondary | ICD-10-CM | POA: Diagnosis not present

## 2023-04-29 DIAGNOSIS — J018 Other acute sinusitis: Secondary | ICD-10-CM | POA: Diagnosis not present

## 2023-04-30 DIAGNOSIS — J301 Allergic rhinitis due to pollen: Secondary | ICD-10-CM | POA: Diagnosis not present

## 2023-05-20 DIAGNOSIS — J328 Other chronic sinusitis: Secondary | ICD-10-CM | POA: Diagnosis not present

## 2023-05-20 DIAGNOSIS — J018 Other acute sinusitis: Secondary | ICD-10-CM | POA: Diagnosis not present

## 2023-05-20 DIAGNOSIS — J302 Other seasonal allergic rhinitis: Secondary | ICD-10-CM | POA: Diagnosis not present

## 2023-05-29 DIAGNOSIS — L811 Chloasma: Secondary | ICD-10-CM | POA: Diagnosis not present

## 2023-05-29 DIAGNOSIS — L814 Other melanin hyperpigmentation: Secondary | ICD-10-CM | POA: Diagnosis not present

## 2023-05-29 DIAGNOSIS — L02821 Furuncle of head [any part, except face]: Secondary | ICD-10-CM | POA: Diagnosis not present

## 2023-05-29 DIAGNOSIS — L718 Other rosacea: Secondary | ICD-10-CM | POA: Diagnosis not present

## 2023-06-17 DIAGNOSIS — J302 Other seasonal allergic rhinitis: Secondary | ICD-10-CM | POA: Diagnosis not present

## 2023-06-17 DIAGNOSIS — J3489 Other specified disorders of nose and nasal sinuses: Secondary | ICD-10-CM | POA: Diagnosis not present

## 2023-06-17 DIAGNOSIS — J328 Other chronic sinusitis: Secondary | ICD-10-CM | POA: Diagnosis not present

## 2023-07-03 ENCOUNTER — Ambulatory Visit: Payer: BC Managed Care – PPO | Admitting: Gastroenterology

## 2023-07-03 ENCOUNTER — Encounter: Payer: Self-pay | Admitting: Gastroenterology

## 2023-07-03 VITALS — BP 109/77 | HR 118 | Temp 98.5°F | Ht 66.0 in | Wt 209.0 lb

## 2023-07-03 DIAGNOSIS — K625 Hemorrhage of anus and rectum: Secondary | ICD-10-CM

## 2023-07-03 DIAGNOSIS — R194 Change in bowel habit: Secondary | ICD-10-CM

## 2023-07-03 MED ORDER — PEG 3350-KCL-NA BICARB-NACL 420 G PO SOLR: 4000.0000 mL | Freq: Once | ORAL | 0 refills | Status: AC

## 2023-07-03 NOTE — Addendum Note (Signed)
Addended by: Roena Malady on: 07/03/2023 03:56 PM   Modules accepted: Orders

## 2023-07-03 NOTE — Progress Notes (Signed)
Gastroenterology Consultation  Referring Provider:     Berniece Salines, FNP Primary Care Physician:  Berniece Salines, FNP Primary Gastroenterologist:  Dr. Servando Snare     Reason for Consultation:     Constipation with rectal bleeding        HPI:   Erin Diaz is a 37 y.o. y/o female referred for consultation & management of the constipation with rectal bleeding by Dr. Zane Herald, Rudolpho Sevin, FNP.  This patient reports that she has had constipation with only a few bowel movements per week since she was very young.  The patient reports that she did have some change in bowel habits with her pregnancy postpartum plan related to stress since her son was hospitalized with RSV 8 days after birth.  She denies any unexplained weight loss fevers chills nausea or vomiting.  She also reports that she has had a history of hemorrhoids but did have some rectal bleeding that she believes could have been from anal fissure because it did not feel like her hemorrhoids.  She now reports that her stools are softer than they had been originally.  She denies any family history of colon cancer or colon polyps.  Past Medical History:  Diagnosis Date   Anxiety and depression     Past Surgical History:  Procedure Laterality Date   NO PAST SURGERIES      Prior to Admission medications   Medication Sig Start Date End Date Taking? Authorizing Provider  amoxicillin-clavulanate (AUGMENTIN) 875-125 MG tablet Take 1 tablet by mouth 2 (two) times daily. 04/15/23   Waldon Merl, PA-C  Cholecalciferol (VITAMIN D3) 50 MCG (2000 UT) capsule Take 1 capsule (2,000 Units total) by mouth daily. 01/29/23   Berniece Salines, FNP  levonorgestrel (MIRENA) 20 MCG/DAY IUD 1 each by Intrauterine route once for 1 dose. 02/19/22 12/05/22  Copland, Ilona Sorrel, PA-C  propranolol (INDERAL) 20 MG tablet Take 20 mg by mouth 2 (two) times daily. 02/06/23 02/06/24  [provider]    No family history on file.   Social History   Tobacco Use    Smoking status: Former    Current packs/day: 0.00    Types: Cigarettes    Quit date: 11/22/2016    Years since quitting: 6.6   Smokeless tobacco: Never  Vaping Use   Vaping status: Never Used  Substance Use Topics   Alcohol use: Not Currently   Drug use: Never    Allergies as of 07/03/2023 - Review Complete 07/03/2023  Allergen Reaction Noted   Lexapro [escitalopram] Other (See Comments) 02/28/2023    Review of Systems:    All systems reviewed and negative except where noted in HPI.   Physical Exam:  BP 109/77 (BP Location: Left Arm, Patient Position: Sitting, Cuff Size: Large)   Pulse (!) 118   Temp 98.5 F (36.9 C) (Oral)   Ht 5\' 6"  (1.676 m)   Wt 209 lb (94.8 kg)   BMI 33.73 kg/m  No LMP recorded. (Menstrual status: IUD). General:   Alert,  Well-developed, well-nourished, pleasant and cooperative in NAD Head:  Normocephalic and atraumatic. Eyes:  Sclera clear, no icterus.   Conjunctiva pink. Ears:  Normal auditory acuity. Neck:  Supple; no masses or thyromegaly. Lungs:  Respirations even and unlabored.  Clear throughout to auscultation.   No wheezes, crackles, or rhonchi. No acute distress. Heart:  Regular rate and rhythm; no murmurs, clicks, rubs, or gallops. Abdomen:  Normal bowel sounds.  No bruits.  Soft, non-tender and  non-distended without masses, hepatosplenomegaly or hernias noted.  No guarding or rebound tenderness.  Negative Carnett sign.   Rectal:  Deferred.  Pulses:  Normal pulses noted. Extremities:  No clubbing or edema.  No cyanosis. Neurologic:  Alert and oriented x3;  grossly normal neurologically. Skin:  Intact without significant lesions or rashes.  No jaundice. Lymph Nodes:  No significant cervical adenopathy. Psych:  Alert and cooperative. Normal mood and affect.  Imaging Studies: No results found.  Assessment and Plan:   Erin Diaz is a 37 y.o. y/o female who comes in today with a history of chronic constipation since she was young with  some change in bowel habits with more soft stools now.  The patient also had some rectal bleeding.  The patient will be set up for a colonoscopy to rule out any other cause for her change in bowel habits or rectal bleeding.  The patient has been explained the plan agrees with it.    Midge Minium, MD. Clementeen Graham    Note: This dictation was prepared with Dragon dictation along with smaller phrase technology. Any transcriptional errors that result from this process are unintentional.

## 2023-08-13 DIAGNOSIS — J3489 Other specified disorders of nose and nasal sinuses: Secondary | ICD-10-CM | POA: Diagnosis not present

## 2023-08-20 ENCOUNTER — Telehealth: Payer: Self-pay | Admitting: Gastroenterology

## 2023-08-20 NOTE — Telephone Encounter (Signed)
Patient called in to reschedule her procedure.

## 2023-08-21 NOTE — Telephone Encounter (Signed)
Pt states that her work schedule just does not allow her time to have procedure at this time and she will call back to r/s

## 2023-08-29 ENCOUNTER — Ambulatory Visit: Admit: 2023-08-29 | Payer: BC Managed Care – PPO | Admitting: Gastroenterology

## 2023-08-29 SURGERY — COLONOSCOPY WITH PROPOFOL
Anesthesia: Choice

## 2023-09-11 DIAGNOSIS — N870 Mild cervical dysplasia: Secondary | ICD-10-CM | POA: Insufficient documentation
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
## 2023-09-15 ENCOUNTER — Other Ambulatory Visit (HOSPITAL_COMMUNITY)
Admission: RE | Admit: 2023-09-15 | Discharge: 2023-09-15 | Disposition: A | Discharge status: Home / Self Care | Payer: BC Managed Care – PPO | Source: Ambulatory Visit | Attending: Obstetrics and Gynecology | Admitting: Obstetrics and Gynecology

## 2023-09-15 ENCOUNTER — Ambulatory Visit (INDEPENDENT_AMBULATORY_CARE_PROVIDER_SITE_OTHER): Payer: BC Managed Care – PPO | Admitting: Obstetrics and Gynecology

## 2023-09-15 ENCOUNTER — Encounter: Payer: Self-pay | Admitting: Obstetrics and Gynecology

## 2023-09-15 VITALS — BP 116/74 | Ht 67.0 in | Wt 214.0 lb

## 2023-09-15 DIAGNOSIS — N870 Mild cervical dysplasia: Secondary | ICD-10-CM | POA: Insufficient documentation

## 2023-09-15 DIAGNOSIS — Z30432 Encounter for removal of intrauterine contraceptive device: Secondary | ICD-10-CM

## 2023-09-15 DIAGNOSIS — F419 Anxiety disorder, unspecified: Secondary | ICD-10-CM

## 2023-09-15 DIAGNOSIS — Z01419 Encounter for gynecological examination (general) (routine) without abnormal findings: Secondary | ICD-10-CM | POA: Diagnosis not present

## 2023-09-15 DIAGNOSIS — Z124 Encounter for screening for malignant neoplasm of cervix: Secondary | ICD-10-CM | POA: Diagnosis not present

## 2023-09-15 DIAGNOSIS — Z1151 Encounter for screening for human papillomavirus (HPV): Secondary | ICD-10-CM

## 2023-09-15 DIAGNOSIS — Z30431 Encounter for routine checking of intrauterine contraceptive device: Secondary | ICD-10-CM

## 2023-09-15 NOTE — Patient Instructions (Signed)
I value your feedback and you entrusting us with your care. If you get a [facility] patient survey, I would appreciate you taking the time to let us know about your experience today. Thank you! ? ? ?

## 2023-09-19 LAB — CYTOLOGY - PAP
Comment: NEGATIVE
Diagnosis: NEGATIVE
High risk HPV: NEGATIVE

## 2023-10-11 ENCOUNTER — Telehealth: Payer: BC Managed Care – PPO | Admitting: Family Medicine

## 2023-10-11 DIAGNOSIS — J029 Acute pharyngitis, unspecified: Secondary | ICD-10-CM | POA: Diagnosis not present

## 2023-10-11 NOTE — Progress Notes (Signed)
E-Visit for Sore Throat  We are sorry that you are not feeling well.  Here is how we plan to help!  Your symptoms indicate a likely viral infection (Pharyngitis).   Pharyngitis is inflammation in the back of the throat which can cause a sore throat, scratchiness and sometimes difficulty swallowing.   Pharyngitis is typically caused by a respiratory virus and will just run its course.  Please keep in mind that your symptoms could last up to 10 days.  For throat pain, we recommend over the counter oral pain relief medications such as acetaminophen or aspirin, or anti-inflammatory medications such as ibuprofen or naproxen sodium.  Topical treatments such as oral throat lozenges or sprays may be used as needed.  Avoid close contact with loved ones, especially the very young and elderly.  Remember to wash your hands thoroughly throughout the day as this is the number one way to prevent the spread of infection and wipe down door knobs and counters with disinfectant.  After careful review of your answers, I would not recommend an antibiotic for your condition.  Antibiotics should not be used to treat conditions that we suspect are caused by viruses like the virus that causes the common cold or flu. However, some people can have Strep with atypical symptoms. You may need formal testing in clinic or office to confirm if your symptoms continue or worsen. You may need a virtual video visit to discuss your symptoms further.   Providers prescribe antibiotics to treat infections caused by bacteria. Antibiotics are very powerful in treating bacterial infections when they are used properly.  To maintain their effectiveness, they should be used only when necessary.  Overuse of antibiotics has resulted in the development of super bugs that are resistant to treatment!    Home Care: Only take medications as instructed by your medical team. Do not drink alcohol while taking these medications. A steam or ultrasonic  humidifier can help congestion.  You can place a towel over your head and breathe in the steam from hot water coming from a faucet. Avoid close contacts especially the very young and the elderly. Cover your mouth when you cough or sneeze. Always remember to wash your hands.  Get Help Right Away If: You develop worsening fever or throat pain. You develop a severe head ache or visual changes. Your symptoms persist after you have completed your treatment plan.  Make sure you Understand these instructions. Will watch your condition. Will get help right away if you are not doing well or get worse.   Thank you for choosing an e-visit.  Your e-visit answers were reviewed by a board certified advanced clinical practitioner to complete your personal care plan. Depending upon the condition, your plan could have included both over the counter or prescription medications.  Please review your pharmacy choice. Make sure the pharmacy is open so you can pick up prescription now. If there is a problem, you may contact your provider through [employer] and have the prescription routed to another pharmacy.  Your safety is important to [place]. If you have drug allergies check your prescription carefully.   For the next 24 hours you can use MyChart to ask questions about today's visit, request a non-urgent call back, or ask for a work or school excuse. You will get an email in the next two days asking about your experience. I hope that your e-visit has been valuable and will speed your recovery.  I have spent 5 minutes in review  of e-visit questionnaire, review and updating patient chart, medical decision making and response to patient.   Reed Pandy, PA-C

## 2023-10-13 ENCOUNTER — Ambulatory Visit
Admission: EM | Admit: 2023-10-13 | Discharge: 2023-10-13 | Disposition: A | Discharge status: Home / Self Care | Payer: BC Managed Care – PPO | Attending: Internal Medicine | Admitting: Internal Medicine

## 2023-10-13 DIAGNOSIS — R509 Fever, unspecified: Secondary | ICD-10-CM | POA: Diagnosis not present

## 2023-10-13 DIAGNOSIS — J020 Streptococcal pharyngitis: Secondary | ICD-10-CM | POA: Diagnosis not present

## 2023-10-13 LAB — GROUP A STREP BY PCR: Group A Strep by PCR: DETECTED — AB

## 2023-10-13 MED ORDER — AMOXICILLIN 500 MG PO CAPS: 500.0000 mg | ORAL_CAPSULE | Freq: Two times a day (BID) | ORAL | 0 refills | Status: AC
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
## 2023-10-13 NOTE — ED Triage Notes (Signed)
low grade fever, throat swell, ear drainage exposed to hand foot mouth 7 days patient doesn't have any spots.

## 2024-05-19 ENCOUNTER — Inpatient Hospital Stay
Admission: RE | Admit: 2024-05-19 | Discharge: 2024-05-19 | Disposition: A | Discharge status: Home / Self Care | Source: Ambulatory Visit | Attending: Nurse Practitioner | Admitting: Nurse Practitioner

## 2024-07-27 ENCOUNTER — Ambulatory Visit
Admission: RE | Admit: 2024-07-27 | Discharge: 2024-07-27 | Disposition: A | Discharge status: Home / Self Care | Payer: Self-pay | Source: Ambulatory Visit | Attending: Emergency Medicine | Admitting: Emergency Medicine

## 2024-07-27 VITALS — BP 121/73 | HR 90 | Temp 98.3°F | Resp 16

## 2024-07-27 DIAGNOSIS — N61 Mastitis without abscess: Secondary | ICD-10-CM | POA: Diagnosis not present

## 2024-07-27 MED ORDER — CEPHALEXIN 500 MG PO CAPS: 1000.0000 mg | ORAL_CAPSULE | Freq: Two times a day (BID) | ORAL | 0 refills | Status: AC

## 2024-07-27 MED ORDER — IBUPROFEN 600 MG PO TABS: 600.0000 mg | ORAL_TABLET | Freq: Four times a day (QID) | ORAL | 0 refills | Status: DC | PRN

## 2024-07-27 NOTE — Discharge Instructions (Addendum)
 Keflex  1000 mg p.o. twice daily for 5 days, and if this does not work, contact us  and let us  know so we can switch you to doxycycline 100 mg p.o. twice daily for 5 days.  Continue warm compresses, 600 mg ibuprofen  combined with 1000 mg of Tylenol  together 3-4 times a day as needed for pain.

## 2024-07-27 NOTE — ED Triage Notes (Signed)
 Pt presents with a lump on the right said of her breast x 3 days. The area is hard to touch, has redness and is sore. She has some discharge coming out of a gland on her breast when she squeezes it.

## 2024-07-27 NOTE — ED Provider Notes (Signed)
 HPI  SUBJECTIVE:  Erin Diaz is a 38 y.o. female who presents with mild soreness, erythema, edema of her right upper nipple and a hard mass starting 4 days ago.  Thinks that it is an infected areolar gland. She states that she was able to express some white pus from the gland, followed by worsening redness and swelling.  No fevers, body aches, axillary lymphadenopathy, pain, increase in size, further drainage.  No other breast erythema, edema.  No antipyretic in the past 6 hours.  She tried warm compresses, ibuprofen  800 mg daily and squeezing it.  The warm compresses and the ibuprofen  helped.  No aggravating factors.  She had a buttock abscess 2 months ago, but is not sure if it was MRSA positive.  LMP: 2 weeks ago.  Denies possibility being pregnant.  PCP: Davene Jacobs   Past Medical History:  Diagnosis Date   Anxiety and depression     Past Surgical History:  Procedure Laterality Date   NO PAST SURGERIES      Family History  Problem Relation Age of Onset   Ovarian cancer Maternal Aunt        20s   Uterine cancer Maternal Aunt        69s    Social History   Tobacco Use   Smoking status: Former    Current packs/day: 0.00    Types: Cigarettes    Quit date: 11/22/2016    Years since quitting: 7.6   Smokeless tobacco: Never  Vaping Use   Vaping status: Never Used  Substance Use Topics   Alcohol use: Yes    Comment: social   Drug use: Never    No current facility-administered medications for this encounter.  Current Outpatient Medications:    cephALEXin  (KEFLEX ) 500 MG capsule, Take 2 capsules (1,000 mg total) by mouth 2 (two) times daily for 5 days., Disp: 20 capsule, Rfl: 0   ibuprofen  (ADVIL ) 600 MG tablet, Take 1 tablet (600 mg total) by mouth every 6 (six) hours as needed., Disp: 30 tablet, Rfl: 0   Cholecalciferol (VITAMIN D3) 50 MCG (2000 UT) capsule, Take 1 capsule (2,000 Units total) by mouth daily., Disp: 90 capsule, Rfl: 3   fexofenadine (ALLEGRA) 180 MG  tablet, Take 180 mg by mouth daily., Disp: , Rfl:    levonorgestrel  (MIRENA ) 20 MCG/DAY IUD, 1 each by Intrauterine route once for 1 dose., Disp: 1 each, Rfl: 0   metroNIDAZOLE (METROGEL) 1 % gel, SMARTSIG:Sparingly Topical Daily, Disp: , Rfl:    propranolol (INDERAL) 20 MG tablet, Take by mouth., Disp: , Rfl:   Allergies  Allergen Reactions   Lexapro  [Escitalopram ] Other (See Comments)     ROS  As noted in HPI.   Physical Exam  BP 121/73 (BP Location: Left Arm)   Pulse 90   Temp 98.3 F (36.8 C) (Oral)   Resp 16   LMP 07/12/2024 (Approximate)   SpO2 99%   Constitutional: Well developed, well nourished, no acute distress Eyes:  EOMI, conjunctiva normal bilaterally HENT: Normocephalic, atraumatic,mucus membranes moist Respiratory: Normal inspiratory effort Cardiovascular: Normal rate GI: nondistended skin: 4 x 2 cm tender area of erythema, mild induration in the 12 to 1 o'clock position right nipple with no expressible purulent drainage.  No nipple drainage.  Chaperone present during exam  Lymph: No right-sided axillary lymphadenopathy Musculoskeletal: no deformities Neurologic: Alert & oriented x 3, no focal neuro deficits Psychiatric: Speech and behavior appropriate   ED Course   Medications - No data to  display  No orders of the defined types were placed in this encounter.   No results found for this or any previous visit (from the past 24 hours). No results found.  ED Clinical Impression  1. Nipple infection in female      ED Assessment/Plan     Concern for an infection/cellulitis/mastitis.  Will send home with Keflex  1000 mg p.o. twice daily for 5 days, and if this does not work, she is to contact here and let us  know so we can switch her to doxycycline 100 mg p.o. twice daily for 5 days.  Continue warm compresses, 600 mg ibuprofen  combined with 1000 mg of Tylenol  together 3-4 times a day as needed for pain.  Follow-up with primary care as  needed.  Discussed MDM, treatment plan, and plan for follow-up with patient.  patient agrees with plan.   Meds ordered this encounter  Medications   cephALEXin  (KEFLEX ) 500 MG capsule    Sig: Take 2 capsules (1,000 mg total) by mouth 2 (two) times daily for 5 days.    Dispense:  20 capsule    Refill:  0   ibuprofen  (ADVIL ) 600 MG tablet    Sig: Take 1 tablet (600 mg total) by mouth every 6 (six) hours as needed.    Dispense:  30 tablet    Refill:  0      *This clinic note was created using Scientist, clinical (histocompatibility and immunogenetics). Therefore, there may be occasional mistakes despite careful proofreading.  ?    Van Knee, MD 07/27/24 1544

## 2024-08-12 ENCOUNTER — Ambulatory Visit: Payer: Self-pay

## 2024-08-12 NOTE — Telephone Encounter (Signed)
 FYI Only or Action Required?: FYI only for provider.  Patient was last seen in primary care on 02/28/2023 by Gareth Mliss FALCON, FNP.  Called Nurse Triage reporting Pain.  Symptoms began several months ago.  Interventions attempted: Other: urgent care evaluation.  Symptoms are: unchanged.  Triage Disposition: See PCP When Office is Open (Within 3 Days)  Patient/caregiver understands and will follow disposition?: Yes  Copied from CRM #[id number]. Topic: Clinical - Red Word Triage >> Aug 12, 2024 10:53 AM Charlet HERO wrote: Red Word that prompted transfer to Nurse Triage: Patient is calling about calf pain in her left leg, she is stating that it comes and goes and when it comes back it gets worst, it varies how it feels it may burn, throb or itching. She went to urgent care and they did not get any answer for the reason. She is wanting to also make a appt for physical. Reason for Disposition  [1] MODERATE pain (e.g., interferes with normal activities, limping) AND [2] present > 3 days  Answer Assessment - Initial Assessment Questions 1. ONSET: When did the pain start?      Intermittent for 1.5 months 2. LOCATION: Where is the pain located?      Left leg calf 3. PAIN: How bad is the pain?    (Scale 1-10; or mild, moderate, severe)     Burn, throb 4. WORK OR EXERCISE: Has there been any recent work or exercise that involved this part of the body?      No 5. CAUSE: What do you think is causing the leg pain?     Thought strain but unsure 6. OTHER SYMPTOMS: Do you have any other symptoms? (e.g., chest pain, back pain, breathing difficulty, swelling, rash, fever, numbness, weakness)     Itching   Was evaluated at urgent care few weeks ago. Wants reevaluation with pcp.  Protocols used: Leg Pain-A-AH

## 2024-08-13 ENCOUNTER — Ambulatory Visit
Admission: RE | Admit: 2024-08-13 | Discharge: 2024-08-13 | Disposition: A | Discharge status: Home / Self Care | Source: Ambulatory Visit | Attending: Nurse Practitioner | Admitting: Nurse Practitioner

## 2024-08-13 ENCOUNTER — Ambulatory Visit: Payer: Self-pay | Admitting: Nurse Practitioner

## 2024-08-13 ENCOUNTER — Encounter: Payer: Self-pay | Admitting: Nurse Practitioner

## 2024-08-13 ENCOUNTER — Ambulatory Visit: Admitting: Nurse Practitioner

## 2024-08-13 VITALS — BP 128/74 | HR 89 | Resp 16 | Ht 67.0 in | Wt 219.8 lb

## 2024-08-13 DIAGNOSIS — M79662 Pain in left lower leg: Secondary | ICD-10-CM | POA: Insufficient documentation

## 2024-08-13 DIAGNOSIS — M779 Enthesopathy, unspecified: Secondary | ICD-10-CM

## 2024-08-13 MED ORDER — PREDNISONE 10 MG (21) PO TBPK: ORAL_TABLET | ORAL | 0 refills | Status: DC

## 2024-08-13 NOTE — Progress Notes (Signed)
 BP 128/74   Pulse 89   Resp 16   Ht 5' 7 (1.702 m)   Wt 219 lb 12.8 oz (99.7 kg)   LMP 08/06/2024 (Approximate)   SpO2 98%   BMI 34.43 kg/m    Subjective:    Patient ID: Erin Diaz, female    DOB: Jun 08, 1986, 38 y.o.   MRN: [id number]  HPI: Erin Diaz is a 38 y.o. female  Chief Complaint  Patient presents with   Leg Pain    Calf worsening onset 2 months unknown trauma    Discussed the use of AI scribe software for clinical note transcription with the patient, who gave verbal consent to proceed.  History of Present Illness Erin Diaz is a 38 year old female who presents with left calf pain.  Left calf pain - Pain present for approximately 1.5 to 2 months - Initially intermittent, now more persistent - Throbbing and aching quality - Pain is sometimes localized, sometimes diffuse - Occasional itchiness associated with discomfort - Movement, such as walking or flexing the calf, occasionally alleviates pain, especially less localized sensations - No noticeable swelling or discoloration - No recent surgeries  Sensory disturbances and restless legs - Occasional restless legs - Pain more noticeable during episodes of other limb sensations, which have occurred in the past - Pain tends to linger beyond those episodes - Uncertain about increased numbness or tingling, but left side feels worse than right  Risk factors for venous disease - Frequent sitting due to office work - Recent air travel over [place] - Not currently using hormonal contraception; Mirena  IUD recently removed         08/13/2024    9:52 AM 02/28/2023    2:05 PM 01/27/2023    2:16 PM  Depression screen PHQ 2/9  Decreased Interest 0 0 0  Down, Depressed, Hopeless 0 0 0  PHQ - 2 Score 0 0 0  Altered sleeping 0 1 0  Tired, decreased energy 0 1 0  Change in appetite 0 0 0  Feeling bad or failure about yourself  0 0 0  Trouble concentrating 0 1 0  Moving slowly or fidgety/restless 0 1 0   Suicidal thoughts 0 0 0  PHQ-9 Score 0 4 0  Difficult doing work/chores Not difficult at all Somewhat difficult Not difficult at all    Relevant past medical, surgical, family and social history reviewed and updated as indicated. Interim medical history since our last visit reviewed. Allergies and medications reviewed and updated.  Review of Systems  Ten systems reviewed and is negative except as mentioned in HPI      Objective:     BP 128/74   Pulse 89   Resp 16   Ht 5' 7 (1.702 m)   Wt 219 lb 12.8 oz (99.7 kg)   LMP 08/06/2024 (Approximate)   SpO2 98%   BMI 34.43 kg/m    Wt Readings from Last 3 Encounters:  08/13/24 219 lb 12.8 oz (99.7 kg)  09/15/23 214 lb (97.1 kg)  07/03/23 209 lb (94.8 kg)    Physical Exam Physical Exam GENERAL: Alert, cooperative, well developed, no acute distress HEENT: Normocephalic, normal oropharynx, moist mucous membranes CHEST: Clear to auscultation bilaterally, No wheezes, rhonchi, or crackles CARDIOVASCULAR: Normal heart rate and rhythm, S1 and S2 normal without murmurs ABDOMEN: Soft, non-tender, non-distended, without organomegaly, Normal bowel sounds EXTREMITIES: No cyanosis or edema, mild left calf tenderness NEUROLOGICAL: Cranial nerves grossly intact, Moves all extremities without gross motor or  sensory deficit   Results for orders placed or performed during the hospital encounter of 10/13/23  Group A Strep by PCR   Collection Time: 10/13/23  2:31 PM   Specimen: Throat; Sterile Swab  Result Value Ref Range   Group A Strep by PCR DETECTED (A) NOT DETECTED          Assessment & Plan:   Problem List Items Addressed This Visit   None Visit Diagnoses       Pain of left calf    -  Primary   Relevant Orders   US  Venous Img Lower Unilateral Left (DVT)        Assessment and Plan Assessment & Plan Left calf pain with evaluation for deep vein thrombosis and possible tendinopathy Left calf pain persisting for 1.5 to 2  months, described as throbbing, aching, and sometimes itchy, with variable localization and intensity. Movement occasionally alleviates symptoms. No significant swelling or discoloration. Differential diagnosis includes deep vein thrombosis (DVT) and tendinopathy. DVT needs to be ruled out due to potential risk factors such as prolonged sitting and recent air travel. If DVT is excluded, tendinopathy is considered likely due to possible overuse or strain. - Order ultrasound of the left calf to rule out deep vein thrombosis. - If ultrasound is normal, consider a short course of steroids for possible tendinopathy.        Follow up plan: Return if symptoms worsen or fail to improve.

## 2024-10-12 ENCOUNTER — Encounter: Payer: Self-pay | Admitting: Emergency Medicine

## 2024-10-12 ENCOUNTER — Ambulatory Visit
Admission: EM | Admit: 2024-10-12 | Discharge: 2024-10-12 | Disposition: A | Discharge status: Home / Self Care | Attending: Physician Assistant | Admitting: Physician Assistant

## 2024-10-12 DIAGNOSIS — H60502 Unspecified acute noninfective otitis externa, left ear: Secondary | ICD-10-CM | POA: Diagnosis not present

## 2024-10-12 DIAGNOSIS — H9202 Otalgia, left ear: Secondary | ICD-10-CM | POA: Diagnosis not present

## 2024-10-12 MED ORDER — OFLOXACIN 0.3 % OT SOLN: 10.0000 [drp] | Freq: Every day | OTIC | 0 refills | Status: AC

## 2024-10-12 NOTE — ED Triage Notes (Signed)
 Pt c/o left ear irritation and discomfort x 1 week. For the past 3 days she has noticed drainage and pain developed in the past day.
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
## 2024-10-13 ENCOUNTER — Ambulatory Visit: Payer: Self-pay

## 2024-12-12 DIAGNOSIS — M7662 Achilles tendinitis, left leg: Secondary | ICD-10-CM | POA: Diagnosis not present

## 2024-12-22 ENCOUNTER — Ambulatory Visit: Admission: EM | Admit: 2024-12-22 | Discharge: 2024-12-22 | Disposition: A | Discharge status: Home / Self Care

## 2024-12-22 DIAGNOSIS — T148XXA Other injury of unspecified body region, initial encounter: Secondary | ICD-10-CM
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
## 2024-12-22 NOTE — Discharge Instructions (Addendum)
 Take ibuprofen  if you develop discomfort.  Follow-up with your primary care provider if you are not improving.  Go to the emergency department if you have worsening symptoms.
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
